# Patient Record
Sex: Female | Born: 1965 | Hispanic: Yes | Marital: Single | Smoking: Never smoker
Health system: Southern US, Community
[De-identification: ages and names within clinical notes are randomized; demographics above are authoritative.]

## PROBLEM LIST (undated history)

## (undated) DIAGNOSIS — K429 Umbilical hernia without obstruction or gangrene: Secondary | ICD-10-CM

## (undated) DIAGNOSIS — D696 Thrombocytopenia, unspecified: Secondary | ICD-10-CM

## (undated) DIAGNOSIS — Z1635 Resistance to multiple antimicrobial drugs: Secondary | ICD-10-CM

## (undated) DIAGNOSIS — S065X9A Traumatic subdural hemorrhage with loss of consciousness of unspecified duration, initial encounter: Secondary | ICD-10-CM

## (undated) DIAGNOSIS — K7460 Unspecified cirrhosis of liver: Secondary | ICD-10-CM

## (undated) DIAGNOSIS — D649 Anemia, unspecified: Secondary | ICD-10-CM

## (undated) DIAGNOSIS — S065XAA Traumatic subdural hemorrhage with loss of consciousness status unknown, initial encounter: Secondary | ICD-10-CM

## (undated) HISTORY — PX: ABDOMINAL HYSTERECTOMY: SHX81

---

## 2010-07-05 ENCOUNTER — Emergency Department: Payer: Self-pay | Admitting: Unknown Physician Specialty

## 2011-10-24 ENCOUNTER — Ambulatory Visit: Payer: Self-pay | Admitting: Internal Medicine

## 2011-11-14 ENCOUNTER — Inpatient Hospital Stay: Payer: Self-pay | Admitting: Internal Medicine

## 2011-11-14 LAB — URINALYSIS, COMPLETE
Bilirubin,UR: NEGATIVE
Ketone: NEGATIVE
Ph: 7 (ref 4.5–8.0)
Protein: 100
RBC,UR: 29 /HPF (ref 0–5)
Squamous Epithelial: 2
WBC UR: 12 /HPF (ref 0–5)

## 2011-11-14 LAB — CBC WITH DIFFERENTIAL/PLATELET
Basophil #: 0 10*3/uL (ref 0.0–0.1)
Eosinophil %: 3.1 %
HCT: 35.7 % (ref 35.0–47.0)
HGB: 11.5 g/dL — ABNORMAL LOW (ref 12.0–16.0)
Lymphocyte #: 0.2 10*3/uL — ABNORMAL LOW (ref 1.0–3.6)
Lymphocyte %: 12.9 %
MCV: 93 fL (ref 80–100)
Monocyte %: 6.4 %
Neutrophil #: 1.5 10*3/uL (ref 1.4–6.5)
RBC: 3.84 10*6/uL (ref 3.80–5.20)
RDW: 17.2 % — ABNORMAL HIGH (ref 11.5–14.5)
WBC: 1.9 10*3/uL — CL (ref 3.6–11.0)

## 2011-11-14 LAB — APTT: Activated PTT: 39.5 secs — ABNORMAL HIGH (ref 23.6–35.9)

## 2011-11-14 LAB — COMPREHENSIVE METABOLIC PANEL
Albumin: 1.9 g/dL — ABNORMAL LOW (ref 3.4–5.0)
Alkaline Phosphatase: 158 U/L — ABNORMAL HIGH (ref 50–136)
Anion Gap: 8 (ref 7–16)
Calcium, Total: 6.7 mg/dL — CL (ref 8.5–10.1)
Co2: 26 mmol/L (ref 21–32)
Creatinine: 0.49 mg/dL — ABNORMAL LOW (ref 0.60–1.30)
EGFR (Non-African Amer.): >60
Glucose: 123 mg/dL — ABNORMAL HIGH (ref 65–99)
Osmolality: 279 (ref 275–301)
SGOT(AST): 89 U/L — ABNORMAL HIGH (ref 15–37)
Sodium: 141 mmol/L (ref 136–145)

## 2011-11-14 LAB — ETHANOL: Ethanol %: 0.344 % (ref 0.000–0.080)

## 2011-11-14 LAB — LIPASE, BLOOD: Lipase: 189 U/L (ref 73–393)

## 2011-11-14 LAB — PROTIME-INR: INR: 1.6

## 2011-11-15 LAB — MAGNESIUM: Magnesium: 1.2 mg/dL — ABNORMAL LOW

## 2011-11-15 LAB — CBC WITH DIFFERENTIAL/PLATELET
Basophil #: 0 10*3/uL (ref 0.0–0.1)
Eosinophil %: 4.1 %
HCT: 33.1 % — ABNORMAL LOW (ref 35.0–47.0)
Lymphocyte #: 0.3 10*3/uL — ABNORMAL LOW (ref 1.0–3.6)
MCH: 30.4 pg (ref 26.0–34.0)
MCV: 93 fL (ref 80–100)
Monocyte #: 0.1 x10 3/mm — ABNORMAL LOW (ref 0.2–0.9)
Neutrophil #: 1.1 10*3/uL — ABNORMAL LOW (ref 1.4–6.5)
Platelet: 23 10*3/uL — CL (ref 150–440)
RBC: 3.55 10*6/uL — ABNORMAL LOW (ref 3.80–5.20)
RDW: 17.2 % — ABNORMAL HIGH (ref 11.5–14.5)
WBC: 1.6 10*3/uL — CL (ref 3.6–11.0)

## 2011-11-15 LAB — BASIC METABOLIC PANEL
Calcium, Total: 6.7 mg/dL — CL (ref 8.5–10.1)
Chloride: 107 mmol/L (ref 98–107)
Co2: 25 mmol/L (ref 21–32)
Creatinine: 0.55 mg/dL — ABNORMAL LOW (ref 0.60–1.30)
EGFR (African American): >60
EGFR (Non-African Amer.): >60
Glucose: 112 mg/dL — ABNORMAL HIGH (ref 65–99)
Potassium: 3.2 mmol/L — ABNORMAL LOW (ref 3.5–5.1)
Sodium: 141 mmol/L (ref 136–145)

## 2011-11-15 LAB — AMMONIA: Ammonia, Plasma: 106 mcmol/L — ABNORMAL HIGH (ref 11–32)

## 2011-11-16 LAB — CBC WITH DIFFERENTIAL/PLATELET
Basophil #: 0 10*3/uL (ref 0.0–0.1)
Eosinophil #: 0.1 10*3/uL (ref 0.0–0.7)
Lymphocyte #: 0.2 10*3/uL — ABNORMAL LOW (ref 1.0–3.6)
MCH: 29.9 pg (ref 26.0–34.0)
MCHC: 31.9 g/dL — ABNORMAL LOW (ref 32.0–36.0)
MCV: 94 fL (ref 80–100)
Monocyte #: 0.2 x10 3/mm (ref 0.2–0.9)
Neutrophil #: 1.6 10*3/uL (ref 1.4–6.5)
Neutrophil %: 76.3 %
Platelet: 30 10*3/uL — CL (ref 150–440)
RBC: 3.55 10*6/uL — ABNORMAL LOW (ref 3.80–5.20)

## 2011-11-16 LAB — BODY FLUID CELL COUNT WITH DIFFERENTIAL
Lymphocytes: 1 %
Neutrophils: 6 %
Other Cells BF: 0 %

## 2011-11-16 LAB — PROTIME-INR
INR: 1.7
Prothrombin Time: 19.9 secs — ABNORMAL HIGH (ref 11.5–14.7)

## 2011-11-17 LAB — CBC WITH DIFFERENTIAL/PLATELET
Basophil #: 0 10*3/uL (ref 0.0–0.1)
Basophil %: 1.7 %
Eosinophil %: 3.8 %
Lymphocyte #: 0.2 10*3/uL — ABNORMAL LOW (ref 1.0–3.6)
MCHC: 33.1 g/dL (ref 32.0–36.0)
MCV: 94 fL (ref 80–100)
Monocyte %: 9 %
Neutrophil %: 77.3 %
Platelet: 43 10*3/uL — ABNORMAL LOW (ref 150–440)
RBC: 3.28 10*6/uL — ABNORMAL LOW (ref 3.80–5.20)
RDW: 17 % — ABNORMAL HIGH (ref 11.5–14.5)
WBC: 2.1 10*3/uL — ABNORMAL LOW (ref 3.6–11.0)

## 2011-11-17 LAB — BASIC METABOLIC PANEL
Anion Gap: 9 (ref 7–16)
BUN: 3 mg/dL — ABNORMAL LOW (ref 7–18)
Chloride: 107 mmol/L (ref 98–107)
EGFR (African American): >60
Glucose: 97 mg/dL (ref 65–99)
Osmolality: 276 (ref 275–301)
Sodium: 140 mmol/L (ref 136–145)

## 2011-11-17 LAB — HEPATIC FUNCTION PANEL A (ARMC)
Alkaline Phosphatase: 149 U/L — ABNORMAL HIGH (ref 50–136)
Bilirubin,Total: 3.8 mg/dL — ABNORMAL HIGH (ref 0.2–1.0)
Total Protein: 6.3 g/dL — ABNORMAL LOW (ref 6.4–8.2)

## 2011-11-18 LAB — BASIC METABOLIC PANEL
Anion Gap: 7 (ref 7–16)
BUN: 2 mg/dL — ABNORMAL LOW (ref 7–18)
Co2: 26 mmol/L (ref 21–32)
Creatinine: 0.69 mg/dL (ref 0.60–1.30)
EGFR (African American): >60
EGFR (Non-African Amer.): >60

## 2011-11-18 LAB — CBC WITH DIFFERENTIAL/PLATELET
Basophil #: 0 10*3/uL (ref 0.0–0.1)
Basophil %: 0.8 %
Basophil %: 0.6 %
Eosinophil #: 0.1 10*3/uL (ref 0.0–0.7)
Eosinophil %: 3.9 %
Eosinophil %: 3.1 %
HCT: 30.7 % — ABNORMAL LOW (ref 35.0–47.0)
HGB: 10.6 g/dL — ABNORMAL LOW (ref 12.0–16.0)
Lymphocyte #: 0.2 10*3/uL — ABNORMAL LOW (ref 1.0–3.6)
MCH: 31.5 pg (ref 26.0–34.0)
MCHC: 34 g/dL (ref 32.0–36.0)
MCV: 93 fL (ref 80–100)
MCV: 93 fL (ref 80–100)
Monocyte #: 0.2 x10 3/mm (ref 0.2–0.9)
Monocyte %: 7.2 %
Monocyte %: 6.9 %
Neutrophil #: 3 10*3/uL (ref 1.4–6.5)
Neutrophil %: 81.4 %
Neutrophil %: 83.5 %
Platelet: 65 10*3/uL — ABNORMAL LOW (ref 150–440)
RBC: 3.37 10*6/uL — ABNORMAL LOW (ref 3.80–5.20)
RDW: 17.5 % — ABNORMAL HIGH (ref 11.5–14.5)
WBC: 3.6 10*3/uL (ref 3.6–11.0)

## 2011-11-18 LAB — PROTIME-INR
INR: 1.9
Prothrombin Time: 22.1 secs — ABNORMAL HIGH (ref 11.5–14.7)

## 2011-11-19 LAB — BASIC METABOLIC PANEL
Anion Gap: 9 (ref 7–16)
Calcium, Total: 7.5 mg/dL — ABNORMAL LOW (ref 8.5–10.1)
Co2: 25 mmol/L (ref 21–32)
EGFR (African American): >60
EGFR (Non-African Amer.): >60
Glucose: 103 mg/dL — ABNORMAL HIGH (ref 65–99)
Osmolality: 265 (ref 275–301)
Sodium: 134 mmol/L — ABNORMAL LOW (ref 136–145)

## 2011-11-20 LAB — BODY FLUID CULTURE

## 2011-11-24 ENCOUNTER — Ambulatory Visit: Payer: Self-pay | Admitting: Internal Medicine

## 2011-12-18 DIAGNOSIS — IMO0002 Reserved for concepts with insufficient information to code with codable children: Secondary | ICD-10-CM

## 2011-12-18 HISTORY — DX: Reserved for concepts with insufficient information to code with codable children: IMO0002

## 2011-12-18 LAB — COMPREHENSIVE METABOLIC PANEL
Albumin: 2.1 g/dL — ABNORMAL LOW (ref 3.4–5.0)
Alkaline Phosphatase: 131 U/L (ref 50–136)
Anion Gap: 13 (ref 7–16)
BUN: 52 mg/dL — ABNORMAL HIGH (ref 7–18)
Calcium, Total: 8 mg/dL — ABNORMAL LOW (ref 8.5–10.1)
Creatinine: 1.9 mg/dL — ABNORMAL HIGH (ref 0.60–1.30)
EGFR (Non-African Amer.): 31 — ABNORMAL LOW
Glucose: 136 mg/dL — ABNORMAL HIGH (ref 65–99)
Osmolality: 281 (ref 275–301)
Potassium: 4.3 mmol/L (ref 3.5–5.1)
SGPT (ALT): 21 U/L (ref 12–78)
Sodium: 132 mmol/L — ABNORMAL LOW (ref 136–145)
Total Protein: 6.5 g/dL (ref 6.4–8.2)

## 2011-12-18 LAB — ETHANOL: Ethanol: <3 mg/dL

## 2011-12-18 LAB — CBC
HCT: 36.2 % (ref 35.0–47.0)
HGB: 12.3 g/dL (ref 12.0–16.0)
MCH: 31.2 pg (ref 26.0–34.0)
MCHC: 34 g/dL (ref 32.0–36.0)
MCV: 92 fL (ref 80–100)
Platelet: 31 10*3/uL — ABNORMAL LOW (ref 150–440)
RBC: 3.95 10*6/uL (ref 3.80–5.20)
WBC: 13.7 10*3/uL — ABNORMAL HIGH (ref 3.6–11.0)

## 2011-12-18 LAB — URINALYSIS, COMPLETE
Bilirubin,UR: NEGATIVE
Glucose,UR: NEGATIVE mg/dL (ref 0–75)
Nitrite: NEGATIVE
Specific Gravity: 1.014 (ref 1.003–1.030)
Squamous Epithelial: NONE SEEN
WBC UR: 815 /HPF (ref 0–5)

## 2011-12-18 LAB — DRUG SCREEN, URINE
Amphetamines, Ur Screen: NEGATIVE (ref ?–1000)
Benzodiazepine, Ur Scrn: NEGATIVE (ref ?–200)
Cocaine Metabolite,Ur ~~LOC~~: NEGATIVE (ref ?–300)
MDMA (Ecstasy)Ur Screen: NEGATIVE (ref ?–500)
Methadone, Ur Screen: NEGATIVE (ref ?–300)
Phencyclidine (PCP) Ur S: NEGATIVE (ref ?–25)
Tricyclic, Ur Screen: NEGATIVE (ref ?–1000)

## 2011-12-18 LAB — CK TOTAL AND CKMB (NOT AT ARMC)
CK, Total: 44 U/L (ref 21–215)
CK-MB: <0.5 ng/mL — ABNORMAL LOW (ref 0.5–3.6)

## 2011-12-18 LAB — TSH: Thyroid Stimulating Horm: 3.55 u[IU]/mL

## 2011-12-18 LAB — PROTIME-INR
INR: 2.1
Prothrombin Time: 23.5 secs — ABNORMAL HIGH (ref 11.5–14.7)

## 2011-12-19 ENCOUNTER — Inpatient Hospital Stay: Payer: Self-pay | Admitting: Internal Medicine

## 2011-12-20 LAB — BASIC METABOLIC PANEL
BUN: 32 mg/dL — ABNORMAL HIGH (ref 7–18)
Chloride: 104 mmol/L (ref 98–107)
Creatinine: 1.42 mg/dL — ABNORMAL HIGH (ref 0.60–1.30)
EGFR (Non-African Amer.): 44 — ABNORMAL LOW
Glucose: 104 mg/dL — ABNORMAL HIGH (ref 65–99)
Potassium: 4 mmol/L (ref 3.5–5.1)
Sodium: 132 mmol/L — ABNORMAL LOW (ref 136–145)

## 2011-12-20 LAB — CBC WITH DIFFERENTIAL/PLATELET
Basophil #: 0.1 10*3/uL (ref 0.0–0.1)
Basophil %: 0.6 %
Eosinophil #: 0.2 10*3/uL (ref 0.0–0.7)
HCT: 31.8 % — ABNORMAL LOW (ref 35.0–47.0)
HGB: 10.9 g/dL — ABNORMAL LOW (ref 12.0–16.0)
Lymphocyte #: 0.2 10*3/uL — ABNORMAL LOW (ref 1.0–3.6)
Lymphocyte %: 1.6 %
MCH: 30.9 pg (ref 26.0–34.0)
MCHC: 34.3 g/dL (ref 32.0–36.0)
MCV: 90 fL (ref 80–100)
Monocyte #: 0.9 x10 3/mm (ref 0.2–0.9)
Neutrophil #: 8.2 10*3/uL — ABNORMAL HIGH (ref 1.4–6.5)
RBC: 3.53 10*6/uL — ABNORMAL LOW (ref 3.80–5.20)
RDW: 17.3 % — ABNORMAL HIGH (ref 11.5–14.5)
WBC: 9.5 10*3/uL (ref 3.6–11.0)

## 2011-12-20 LAB — URINE CULTURE

## 2011-12-21 LAB — CBC WITH DIFFERENTIAL/PLATELET
Basophil #: 0 10*3/uL (ref 0.0–0.1)
Basophil %: 0.3 %
Eosinophil #: 0.1 10*3/uL (ref 0.0–0.7)
Eosinophil %: 1.1 %
HCT: 32.9 % — ABNORMAL LOW (ref 35.0–47.0)
HGB: 11.4 g/dL — ABNORMAL LOW (ref 12.0–16.0)
Lymphocyte #: 0.3 10*3/uL — ABNORMAL LOW (ref 1.0–3.6)
MCH: 30.9 pg (ref 26.0–34.0)
MCHC: 34.5 g/dL (ref 32.0–36.0)
MCV: 90 fL (ref 80–100)
Monocyte #: 1.3 x10 3/mm — ABNORMAL HIGH (ref 0.2–0.9)
Monocyte %: 13.3 %
Neutrophil #: 8.1 10*3/uL — ABNORMAL HIGH (ref 1.4–6.5)
Platelet: 21 10*3/uL — CL (ref 150–440)
RDW: 17.8 % — ABNORMAL HIGH (ref 11.5–14.5)
WBC: 9.9 10*3/uL (ref 3.6–11.0)

## 2011-12-21 LAB — BASIC METABOLIC PANEL
BUN: 27 mg/dL — ABNORMAL HIGH (ref 7–18)
Calcium, Total: 7.7 mg/dL — ABNORMAL LOW (ref 8.5–10.1)
Chloride: 104 mmol/L (ref 98–107)
Creatinine: 1.14 mg/dL (ref 0.60–1.30)
EGFR (African American): >60
EGFR (Non-African Amer.): 57 — ABNORMAL LOW
Glucose: 95 mg/dL (ref 65–99)
Osmolality: 273 (ref 275–301)
Potassium: 3.6 mmol/L (ref 3.5–5.1)

## 2011-12-22 LAB — CULTURE, BLOOD (SINGLE)

## 2012-01-11 LAB — URINALYSIS, COMPLETE
Bacteria: NONE SEEN
Bilirubin,UR: NEGATIVE
Nitrite: NEGATIVE
RBC,UR: 37 /HPF (ref 0–5)
Squamous Epithelial: 5

## 2012-01-11 LAB — CBC
HCT: 28.7 % — ABNORMAL LOW (ref 35.0–47.0)
HGB: 9.9 g/dL — ABNORMAL LOW (ref 12.0–16.0)
Platelet: 34 10*3/uL — ABNORMAL LOW (ref 150–440)
RBC: 3.1 10*6/uL — ABNORMAL LOW (ref 3.80–5.20)
RDW: 22.8 % — ABNORMAL HIGH (ref 11.5–14.5)
WBC: 2.3 10*3/uL — ABNORMAL LOW (ref 3.6–11.0)

## 2012-01-11 LAB — COMPREHENSIVE METABOLIC PANEL
Albumin: 1.8 g/dL — ABNORMAL LOW (ref 3.4–5.0)
BUN: 7 mg/dL (ref 7–18)
Co2: 19 mmol/L — ABNORMAL LOW (ref 21–32)
Glucose: 116 mg/dL — ABNORMAL HIGH (ref 65–99)
SGOT(AST): 33 U/L (ref 15–37)
SGPT (ALT): 13 U/L (ref 12–78)
Total Protein: 6 g/dL — ABNORMAL LOW (ref 6.4–8.2)

## 2012-01-12 ENCOUNTER — Observation Stay: Payer: Self-pay | Admitting: Specialist

## 2012-01-12 ENCOUNTER — Ambulatory Visit: Payer: Self-pay | Admitting: Internal Medicine

## 2012-01-12 LAB — CBC WITH DIFFERENTIAL/PLATELET
Eosinophil %: 7 %
Lymphocyte %: 17.7 %
MCH: 31.3 pg (ref 26.0–34.0)
Monocyte #: 0.3 x10 3/mm (ref 0.2–0.9)
Monocyte %: 14.9 %
Neutrophil #: 1.2 10*3/uL — ABNORMAL LOW (ref 1.4–6.5)
Neutrophil %: 59.3 %
Platelet: 33 10*3/uL — ABNORMAL LOW (ref 150–440)
RBC: 2.9 10*6/uL — ABNORMAL LOW (ref 3.80–5.20)
WBC: 2 10*3/uL — CL (ref 3.6–11.0)

## 2012-01-12 LAB — BASIC METABOLIC PANEL
BUN: 7 mg/dL (ref 7–18)
Calcium, Total: 7.3 mg/dL — ABNORMAL LOW (ref 8.5–10.1)
EGFR (African American): >60
EGFR (Non-African Amer.): >60
Glucose: 103 mg/dL — ABNORMAL HIGH (ref 65–99)
Osmolality: 279 (ref 275–301)

## 2012-01-12 LAB — PROTIME-INR
INR: 2.1
Prothrombin Time: 23.6 secs — ABNORMAL HIGH (ref 11.5–14.7)

## 2012-01-12 LAB — APTT: Activated PTT: 64.4 secs — ABNORMAL HIGH (ref 23.6–35.9)

## 2012-01-13 LAB — APTT: Activated PTT: 42.8 secs — ABNORMAL HIGH (ref 23.6–35.9)

## 2012-01-13 LAB — PROTIME-INR: INR: 2

## 2012-01-13 LAB — PLATELET COUNT
Platelet: 25 10*3/uL — CL (ref 150–440)
Platelet: 44 10*3/uL — ABNORMAL LOW (ref 150–440)

## 2012-01-24 ENCOUNTER — Ambulatory Visit: Payer: Self-pay | Admitting: Internal Medicine

## 2012-12-21 ENCOUNTER — Inpatient Hospital Stay: Payer: Self-pay | Admitting: Family Medicine

## 2012-12-21 LAB — COMPREHENSIVE METABOLIC PANEL
Alkaline Phosphatase: 134 U/L (ref 50–136)
BUN: 8 mg/dL (ref 7–18)
Calcium, Total: 7.5 mg/dL — ABNORMAL LOW (ref 8.5–10.1)
Creatinine: 0.8 mg/dL (ref 0.60–1.30)
Glucose: 97 mg/dL (ref 65–99)
Potassium: 3.3 mmol/L — ABNORMAL LOW (ref 3.5–5.1)
SGPT (ALT): 13 U/L (ref 12–78)
Sodium: 137 mmol/L (ref 136–145)
Total Protein: 6.1 g/dL — ABNORMAL LOW (ref 6.4–8.2)

## 2012-12-21 LAB — URINALYSIS, COMPLETE
Bacteria: NONE SEEN
Bilirubin,UR: NEGATIVE
Glucose,UR: NEGATIVE mg/dL (ref 0–75)
Ketone: NEGATIVE
Leukocyte Esterase: NEGATIVE
Nitrite: NEGATIVE
Protein: NEGATIVE
Squamous Epithelial: 3
WBC UR: 1 /HPF (ref 0–5)

## 2012-12-21 LAB — CBC
HGB: 10 g/dL — ABNORMAL LOW (ref 12.0–16.0)
MCH: 32.9 pg (ref 26.0–34.0)
RBC: 3.03 10*6/uL — ABNORMAL LOW (ref 3.80–5.20)
WBC: 2.2 10*3/uL — ABNORMAL LOW (ref 3.6–11.0)

## 2012-12-21 LAB — PROTIME-INR: Prothrombin Time: 22 secs — ABNORMAL HIGH (ref 11.5–14.7)

## 2012-12-22 LAB — CBC WITH DIFFERENTIAL/PLATELET
Basophil %: 1 %
Eosinophil #: 0.2 10*3/uL (ref 0.0–0.7)
HGB: 9.5 g/dL — ABNORMAL LOW (ref 12.0–16.0)
Lymphocyte %: 14.9 %
MCH: 32.6 pg (ref 26.0–34.0)
MCV: 93 fL (ref 80–100)
Monocyte #: 0.3 x10 3/mm (ref 0.2–0.9)
Monocyte %: 11.5 %
Neutrophil %: 65.5 %
Platelet: 57 10*3/uL — ABNORMAL LOW (ref 150–440)
RBC: 2.9 10*6/uL — ABNORMAL LOW (ref 3.80–5.20)
RDW: 16.8 % — ABNORMAL HIGH (ref 11.5–14.5)
WBC: 2.3 10*3/uL — ABNORMAL LOW (ref 3.6–11.0)

## 2012-12-22 LAB — COMPREHENSIVE METABOLIC PANEL
Alkaline Phosphatase: 126 U/L (ref 50–136)
BUN: 8 mg/dL (ref 7–18)
Bilirubin,Total: 2.6 mg/dL — ABNORMAL HIGH (ref 0.2–1.0)
Calcium, Total: 7.4 mg/dL — ABNORMAL LOW (ref 8.5–10.1)
Co2: 24 mmol/L (ref 21–32)
EGFR (Non-African Amer.): >60
Glucose: 95 mg/dL (ref 65–99)
Osmolality: 276 (ref 275–301)
Potassium: 3.3 mmol/L — ABNORMAL LOW (ref 3.5–5.1)
Sodium: 139 mmol/L (ref 136–145)

## 2012-12-22 LAB — MAGNESIUM: Magnesium: 1.2 mg/dL — ABNORMAL LOW

## 2012-12-22 LAB — PROTIME-INR: INR: 1.8

## 2012-12-23 LAB — COMPREHENSIVE METABOLIC PANEL
Albumin: 1.7 g/dL — ABNORMAL LOW (ref 3.4–5.0)
Bilirubin,Total: 2.8 mg/dL — ABNORMAL HIGH (ref 0.2–1.0)
Co2: 24 mmol/L (ref 21–32)
Creatinine: 0.8 mg/dL (ref 0.60–1.30)
EGFR (African American): >60
EGFR (Non-African Amer.): >60
Glucose: 90 mg/dL (ref 65–99)
Osmolality: 270 (ref 275–301)
Potassium: 3.8 mmol/L (ref 3.5–5.1)
SGOT(AST): 33 U/L (ref 15–37)
SGPT (ALT): 13 U/L (ref 12–78)
Sodium: 136 mmol/L (ref 136–145)

## 2012-12-24 LAB — COMPREHENSIVE METABOLIC PANEL
Albumin: 2 g/dL — ABNORMAL LOW (ref 3.4–5.0)
Alkaline Phosphatase: 122 U/L (ref 50–136)
BUN: 10 mg/dL (ref 7–18)
Bilirubin,Total: 3.1 mg/dL — ABNORMAL HIGH (ref 0.2–1.0)
Chloride: 102 mmol/L (ref 98–107)
Creatinine: 0.84 mg/dL (ref 0.60–1.30)
EGFR (African American): >60
EGFR (Non-African Amer.): >60
Glucose: 88 mg/dL (ref 65–99)
Potassium: 3.4 mmol/L — ABNORMAL LOW (ref 3.5–5.1)
SGPT (ALT): 14 U/L (ref 12–78)
Total Protein: 6.2 g/dL — ABNORMAL LOW (ref 6.4–8.2)

## 2012-12-24 LAB — PROTIME-INR: INR: 2

## 2012-12-24 LAB — PLATELET COUNT: Platelet: 61 10*3/uL — ABNORMAL LOW (ref 150–440)

## 2012-12-25 LAB — CBC WITH DIFFERENTIAL/PLATELET
Basophil #: 0 10*3/uL (ref 0.0–0.1)
Basophil %: 1.3 %
Eosinophil %: 8.2 %
Lymphocyte #: 0.4 10*3/uL — ABNORMAL LOW (ref 1.0–3.6)
MCV: 91 fL (ref 80–100)
Monocyte %: 12.9 %
Neutrophil #: 1.8 10*3/uL (ref 1.4–6.5)
RBC: 3.33 10*6/uL — ABNORMAL LOW (ref 3.80–5.20)
RDW: 16.8 % — ABNORMAL HIGH (ref 11.5–14.5)

## 2012-12-25 LAB — POTASSIUM: Potassium: 3.6 mmol/L (ref 3.5–5.1)

## 2012-12-25 LAB — PROTIME-INR: Prothrombin Time: 22.6 secs — ABNORMAL HIGH (ref 11.5–14.7)

## 2012-12-26 LAB — PROTIME-INR
INR: 1.7
Prothrombin Time: 19.5 secs — ABNORMAL HIGH (ref 11.5–14.7)

## 2013-07-25 ENCOUNTER — Emergency Department: Payer: Self-pay | Admitting: Emergency Medicine

## 2013-07-25 LAB — COMPREHENSIVE METABOLIC PANEL
ALK PHOS: 144 U/L — AB
ALT: 21 U/L (ref 12–78)
Albumin: 1.9 g/dL — ABNORMAL LOW (ref 3.4–5.0)
Anion Gap: 7 (ref 7–16)
BUN: 5 mg/dL — AB (ref 7–18)
Bilirubin,Total: 4.1 mg/dL — ABNORMAL HIGH (ref 0.2–1.0)
Calcium, Total: 6.9 mg/dL — CL (ref 8.5–10.1)
Chloride: 108 mmol/L — ABNORMAL HIGH (ref 98–107)
Co2: 26 mmol/L (ref 21–32)
Creatinine: 0.62 mg/dL (ref 0.60–1.30)
EGFR (African American): >60
Glucose: 113 mg/dL — ABNORMAL HIGH (ref 65–99)
OSMOLALITY: 279 (ref 275–301)
Potassium: 3.1 mmol/L — ABNORMAL LOW (ref 3.5–5.1)
SGOT(AST): 80 U/L — ABNORMAL HIGH (ref 15–37)
SODIUM: 141 mmol/L (ref 136–145)
Total Protein: 6.5 g/dL (ref 6.4–8.2)

## 2013-07-25 LAB — CBC
HCT: 31.6 % — AB (ref 35.0–47.0)
HGB: 10.5 g/dL — AB (ref 12.0–16.0)
MCH: 31.2 pg (ref 26.0–34.0)
MCHC: 33.3 g/dL (ref 32.0–36.0)
MCV: 94 fL (ref 80–100)
PLATELETS: 33 10*3/uL — AB (ref 150–440)
RBC: 3.37 10*6/uL — ABNORMAL LOW (ref 3.80–5.20)
RDW: 16.1 % — ABNORMAL HIGH (ref 11.5–14.5)
WBC: 1.8 10*3/uL — AB (ref 3.6–11.0)

## 2013-07-25 LAB — PROTIME-INR
INR: 2
Prothrombin Time: 22.6 secs — ABNORMAL HIGH (ref 11.5–14.7)

## 2013-07-25 LAB — APTT: Activated PTT: 47.4 secs — ABNORMAL HIGH (ref 23.6–35.9)

## 2013-07-26 ENCOUNTER — Encounter (HOSPITAL_COMMUNITY): Payer: Self-pay | Admitting: Pulmonary Disease

## 2013-07-26 ENCOUNTER — Inpatient Hospital Stay (HOSPITAL_COMMUNITY)
Admission: EM | Admit: 2013-07-26 | Discharge: 2013-08-07 | DRG: 085 | Disposition: A | Discharge status: Skilled Nursing Facility | Payer: Self-pay | Source: Other Acute Inpatient Hospital | Attending: Internal Medicine | Admitting: Internal Medicine

## 2013-07-26 ENCOUNTER — Inpatient Hospital Stay (HOSPITAL_COMMUNITY): Payer: Self-pay

## 2013-07-26 DIAGNOSIS — S065X0A Traumatic subdural hemorrhage without loss of consciousness, initial encounter: Principal | ICD-10-CM | POA: Diagnosis present

## 2013-07-26 DIAGNOSIS — E43 Unspecified severe protein-calorie malnutrition: Secondary | ICD-10-CM | POA: Diagnosis present

## 2013-07-26 DIAGNOSIS — S1093XA Contusion of unspecified part of neck, initial encounter: Secondary | ICD-10-CM

## 2013-07-26 DIAGNOSIS — R188 Other ascites: Secondary | ICD-10-CM | POA: Diagnosis present

## 2013-07-26 DIAGNOSIS — S0083XA Contusion of other part of head, initial encounter: Secondary | ICD-10-CM

## 2013-07-26 DIAGNOSIS — F10939 Alcohol use, unspecified with withdrawal, unspecified: Secondary | ICD-10-CM | POA: Diagnosis present

## 2013-07-26 DIAGNOSIS — Z6826 Body mass index (BMI) 26.0-26.9, adult: Secondary | ICD-10-CM

## 2013-07-26 DIAGNOSIS — K7030 Alcoholic cirrhosis of liver without ascites: Secondary | ICD-10-CM | POA: Diagnosis present

## 2013-07-26 DIAGNOSIS — F10239 Alcohol dependence with withdrawal, unspecified: Secondary | ICD-10-CM | POA: Diagnosis present

## 2013-07-26 DIAGNOSIS — D61818 Other pancytopenia: Secondary | ICD-10-CM | POA: Diagnosis present

## 2013-07-26 DIAGNOSIS — M79609 Pain in unspecified limb: Secondary | ICD-10-CM | POA: Diagnosis present

## 2013-07-26 DIAGNOSIS — M7989 Other specified soft tissue disorders: Secondary | ICD-10-CM | POA: Diagnosis present

## 2013-07-26 DIAGNOSIS — I10 Essential (primary) hypertension: Secondary | ICD-10-CM | POA: Diagnosis present

## 2013-07-26 DIAGNOSIS — G9340 Encephalopathy, unspecified: Secondary | ICD-10-CM | POA: Diagnosis present

## 2013-07-26 DIAGNOSIS — N390 Urinary tract infection, site not specified: Secondary | ICD-10-CM | POA: Diagnosis present

## 2013-07-26 DIAGNOSIS — F1020 Alcohol dependence, uncomplicated: Secondary | ICD-10-CM | POA: Diagnosis present

## 2013-07-26 DIAGNOSIS — R5383 Other fatigue: Secondary | ICD-10-CM

## 2013-07-26 DIAGNOSIS — K7682 Hepatic encephalopathy: Secondary | ICD-10-CM | POA: Diagnosis present

## 2013-07-26 DIAGNOSIS — R5381 Other malaise: Secondary | ICD-10-CM | POA: Diagnosis present

## 2013-07-26 DIAGNOSIS — E876 Hypokalemia: Secondary | ICD-10-CM | POA: Diagnosis present

## 2013-07-26 DIAGNOSIS — D696 Thrombocytopenia, unspecified: Secondary | ICD-10-CM

## 2013-07-26 DIAGNOSIS — K429 Umbilical hernia without obstruction or gangrene: Secondary | ICD-10-CM | POA: Diagnosis present

## 2013-07-26 DIAGNOSIS — G319 Degenerative disease of nervous system, unspecified: Secondary | ICD-10-CM | POA: Diagnosis present

## 2013-07-26 DIAGNOSIS — S0003XA Contusion of scalp, initial encounter: Secondary | ICD-10-CM | POA: Diagnosis present

## 2013-07-26 DIAGNOSIS — S065X9A Traumatic subdural hemorrhage with loss of consciousness of unspecified duration, initial encounter: Secondary | ICD-10-CM

## 2013-07-26 DIAGNOSIS — S065XAA Traumatic subdural hemorrhage with loss of consciousness status unknown, initial encounter: Secondary | ICD-10-CM

## 2013-07-26 DIAGNOSIS — A498 Other bacterial infections of unspecified site: Secondary | ICD-10-CM | POA: Diagnosis present

## 2013-07-26 DIAGNOSIS — S5010XA Contusion of unspecified forearm, initial encounter: Secondary | ICD-10-CM | POA: Diagnosis present

## 2013-07-26 DIAGNOSIS — K7290 Hepatic failure, unspecified without coma: Secondary | ICD-10-CM | POA: Diagnosis present

## 2013-07-26 DIAGNOSIS — W19XXXA Unspecified fall, initial encounter: Secondary | ICD-10-CM | POA: Diagnosis present

## 2013-07-26 DIAGNOSIS — R000 Tachycardia, unspecified: Secondary | ICD-10-CM | POA: Diagnosis present

## 2013-07-26 HISTORY — DX: Unspecified cirrhosis of liver: K74.60

## 2013-07-26 HISTORY — DX: Resistance to multiple antimicrobial drugs: Z16.35

## 2013-07-26 HISTORY — DX: Anemia, unspecified: D64.9

## 2013-07-26 HISTORY — DX: Umbilical hernia without obstruction or gangrene: K42.9

## 2013-07-26 HISTORY — DX: Traumatic subdural hemorrhage with loss of consciousness status unknown, initial encounter: S06.5XAA

## 2013-07-26 HISTORY — DX: Thrombocytopenia, unspecified: D69.6

## 2013-07-26 HISTORY — DX: Traumatic subdural hemorrhage with loss of consciousness of unspecified duration, initial encounter: S06.5X9A

## 2013-07-26 LAB — TROPONIN I: Troponin I: <0.3 ng/mL (ref <0.30)

## 2013-07-26 LAB — HEPATIC FUNCTION PANEL
ALT: 11 U/L (ref 0–35)
AST: 64 U/L — ABNORMAL HIGH (ref 0–37)
Albumin: 1.6 g/dL — ABNORMAL LOW (ref 3.5–5.2)
Alkaline Phosphatase: 109 U/L (ref 39–117)
BILIRUBIN INDIRECT: 1.5 mg/dL — AB (ref 0.3–0.9)
BILIRUBIN TOTAL: 3.3 mg/dL — AB (ref 0.3–1.2)
Bilirubin, Direct: 1.8 mg/dL — ABNORMAL HIGH (ref 0.0–0.3)
Total Protein: 5.3 g/dL — ABNORMAL LOW (ref 6.0–8.3)

## 2013-07-26 LAB — PROCALCITONIN: Procalcitonin: <0.1 ng/mL

## 2013-07-26 LAB — CBC
HCT: 25.5 % — ABNORMAL LOW (ref 36.0–46.0)
HCT: 26.9 % — ABNORMAL LOW (ref 36.0–46.0)
HEMOGLOBIN: 8.8 g/dL — AB (ref 12.0–15.0)
Hemoglobin: 9 g/dL — ABNORMAL LOW (ref 12.0–15.0)
MCH: 32 pg (ref 26.0–34.0)
MCH: 31.8 pg (ref 26.0–34.0)
MCHC: 34.5 g/dL (ref 30.0–36.0)
MCHC: 33.5 g/dL (ref 30.0–36.0)
MCV: 92.7 fL (ref 78.0–100.0)
MCV: 95.1 fL (ref 78.0–100.0)
Platelets: 20 10*3/uL — CL (ref 150–400)
Platelets: 35 10*3/uL — ABNORMAL LOW (ref 150–400)
RBC: 2.75 MIL/uL — ABNORMAL LOW (ref 3.87–5.11)
RBC: 2.83 MIL/uL — AB (ref 3.87–5.11)
RDW: 16 % — ABNORMAL HIGH (ref 11.5–15.5)
RDW: 15.8 % — AB (ref 11.5–15.5)
WBC: 0.9 10*3/uL — CL (ref 4.0–10.5)
WBC: 1 10*3/uL — AB (ref 4.0–10.5)

## 2013-07-26 LAB — ABO/RH: ABO/RH(D): A POS

## 2013-07-26 LAB — HIV ANTIBODY (ROUTINE TESTING W REFLEX): HIV: NONREACTIVE

## 2013-07-26 LAB — BASIC METABOLIC PANEL
BUN: 4 mg/dL — ABNORMAL LOW (ref 6–23)
CALCIUM: 6.7 mg/dL — AB (ref 8.4–10.5)
CO2: 23 mEq/L (ref 19–32)
CREATININE: 0.51 mg/dL (ref 0.50–1.10)
Chloride: 106 mEq/L (ref 96–112)
GFR calc Af Amer: >90 mL/min (ref >90)
GLUCOSE: 89 mg/dL (ref 70–99)
Potassium: 3.1 mEq/L — ABNORMAL LOW (ref 3.7–5.3)
SODIUM: 140 meq/L (ref 137–147)

## 2013-07-26 LAB — PHOSPHORUS: PHOSPHORUS: 3.1 mg/dL (ref 2.3–4.6)

## 2013-07-26 LAB — MAGNESIUM: MAGNESIUM: 1.1 mg/dL — AB (ref 1.5–2.5)

## 2013-07-26 LAB — LACTIC ACID, PLASMA: Lactic Acid, Venous: 1.6 mmol/L (ref 0.5–2.2)

## 2013-07-26 LAB — MRSA PCR SCREENING: MRSA by PCR: NEGATIVE

## 2013-07-26 MED ORDER — SODIUM CHLORIDE 0.9 % IV SOLN
INTRAVENOUS | Status: DC
  Administered 2013-07-26: 02:00:00 via INTRAVENOUS

## 2013-07-26 MED ORDER — LORAZEPAM 1 MG PO TABS
0.0000 mg | ORAL_TABLET | Freq: Four times a day (QID) | ORAL | Status: AC
  Administered 2013-07-26: 1 mg via ORAL
  Administered 2013-07-26: 2 mg via ORAL
  Administered 2013-07-27 (×2): 1 mg via ORAL
  Filled 2013-07-26 (×2): qty 1
  Filled 2013-07-26: qty 2
  Filled 2013-07-26: qty 1

## 2013-07-26 MED ORDER — FOLIC ACID 1 MG PO TABS
1.0000 mg | ORAL_TABLET | Freq: Every day | ORAL | Status: DC
  Administered 2013-07-26 – 2013-08-07 (×13): 1 mg via ORAL
  Filled 2013-07-26 (×13): qty 1

## 2013-07-26 MED ORDER — VITAMIN B-1 100 MG PO TABS
100.0000 mg | ORAL_TABLET | Freq: Every day | ORAL | Status: DC
  Administered 2013-07-26: 100 mg via ORAL
  Filled 2013-07-26 (×2): qty 1

## 2013-07-26 MED ORDER — BOOST / RESOURCE BREEZE PO LIQD
1.0000 | Freq: Three times a day (TID) | ORAL | Status: DC
  Administered 2013-07-26 – 2013-08-02 (×14): 1 via ORAL

## 2013-07-26 MED ORDER — LORAZEPAM 1 MG PO TABS
0.0000 mg | ORAL_TABLET | Freq: Two times a day (BID) | ORAL | Status: AC
  Administered 2013-07-28: 2 mg via ORAL
  Administered 2013-07-28: 4 mg via ORAL
  Administered 2013-07-29: 2 mg via ORAL
  Administered 2013-07-29: 1 mg via ORAL
  Filled 2013-07-26 (×2): qty 2
  Filled 2013-07-26: qty 4
  Filled 2013-07-26: qty 2

## 2013-07-26 MED ORDER — LORAZEPAM 2 MG/ML IJ SOLN
1.0000 mg | Freq: Four times a day (QID) | INTRAMUSCULAR | Status: AC | PRN
  Administered 2013-07-26: 18:00:00 via INTRAVENOUS
  Administered 2013-07-28: 1 mg via INTRAVENOUS
  Filled 2013-07-26 (×3): qty 1

## 2013-07-26 MED ORDER — ONDANSETRON HCL 4 MG/2ML IJ SOLN
4.0000 mg | Freq: Four times a day (QID) | INTRAMUSCULAR | Status: DC | PRN
Start: 2013-07-26 — End: 2013-07-26
  Administered 2013-07-26: 4 mg via INTRAVENOUS
  Filled 2013-07-26: qty 2

## 2013-07-26 MED ORDER — ONDANSETRON HCL 4 MG PO TABS: 4.0000 mg | ORAL_TABLET | Freq: Four times a day (QID) | ORAL | Status: DC | PRN

## 2013-07-26 MED ORDER — LORAZEPAM 1 MG PO TABS
1.0000 mg | ORAL_TABLET | Freq: Four times a day (QID) | ORAL | Status: AC | PRN
  Administered 2013-07-27 – 2013-07-28 (×2): 1 mg via ORAL
  Filled 2013-07-26: qty 1

## 2013-07-26 MED ORDER — THIAMINE HCL 100 MG/ML IJ SOLN
100.0000 mg | Freq: Every day | INTRAMUSCULAR | Status: DC
  Filled 2013-07-26 (×2): qty 1

## 2013-07-26 MED ORDER — SODIUM CHLORIDE 0.9 % IV SOLN: INTRAVENOUS | Status: DC

## 2013-07-26 MED ORDER — FENTANYL CITRATE 0.05 MG/ML IJ SOLN
12.5000 ug | INTRAMUSCULAR | Status: DC | PRN
  Administered 2013-07-26 – 2013-07-28 (×4): 25 ug via INTRAVENOUS
  Filled 2013-07-26 (×4): qty 2

## 2013-07-26 MED ORDER — MAGNESIUM SULFATE 4000MG/100ML IJ SOLN
4.0000 g | Freq: Once | INTRAMUSCULAR | Status: AC
  Administered 2013-07-27: 4 g via INTRAVENOUS
  Filled 2013-07-26: qty 100

## 2013-07-26 MED ORDER — ADULT MULTIVITAMIN W/MINERALS CH
1.0000 | ORAL_TABLET | Freq: Every day | ORAL | Status: DC
  Administered 2013-07-26 – 2013-08-07 (×12): 1 via ORAL
  Filled 2013-07-26 (×13): qty 1

## 2013-07-26 MED ORDER — LACTULOSE 10 GM/15ML PO SOLN
30.0000 g | Freq: Three times a day (TID) | ORAL | Status: DC
  Administered 2013-07-26 – 2013-07-29 (×11): 30 g via ORAL
  Filled 2013-07-26 (×13): qty 45
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
## 2013-07-26 NOTE — Progress Notes (Signed)
Patient ID: Colleen Greene, female   DOB: [birth year], 48 y.o.   MRN: [id number] BP 112/63  Pulse 97  Temp(Src) 99.6 F (37.6 C) (Oral)  Resp 19  Ht 5' (1.524 m)  Wt 62.4 kg (137 lb 9.1 oz)  BMI 26.87 kg/m2  SpO2 95% Slightly lethargic Following commands Appreciate ccm help Do not believe CT needed now.  Need to correct metabolic abnormalities where possible.  Neuro exam is stable. Continue alcohol withdrawal protocol. I do believe this is reason for the lethargy currently.
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
## 2013-07-26 NOTE — Progress Notes (Signed)
Utilization review completed. Reginaldo Hazard, RN, BSN. 

## 2013-07-26 NOTE — Progress Notes (Signed)
eLink Physician-Brief Progress Note Patient Name: Colleen RetortMaria Greene DOB: 1965/11/12 MRN: [id number]  Date of Service  07/26/2013   HPI/Events of Note     eICU Interventions  hypomag-repleted   Intervention Category Intermediate Interventions: Electrolyte abnormality - evaluation and management  ALVA,RAKESH V. 07/26/2013, 11:38 PM
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
## 2013-07-26 NOTE — Progress Notes (Signed)
CRITICAL VALUE ALERT  Critical value received:  WBC 0.9  Date of notification:  07/26/2013  Time of notification:  0630  Critical value read back: Yes  Nurse who received alert:  Ripley FraiseHarper, Madissen Wyse D  MD notified (1st page):  ELINK  Time of first page:  0640  MD notified (2nd page):  Time of second page:  Responding MD:  Pola CornELINK  Time MD responded:  [phone]

## 2013-07-27 DIAGNOSIS — E43 Unspecified severe protein-calorie malnutrition: Secondary | ICD-10-CM

## 2013-07-27 LAB — CBC WITH DIFFERENTIAL/PLATELET
BASOS PCT: 1 % (ref 0–1)
Basophils Absolute: 0 10*3/uL (ref 0.0–0.1)
Basophils Absolute: 0 10*3/uL (ref 0.0–0.1)
Basophils Relative: 0 % (ref 0–1)
EOS ABS: 0 10*3/uL (ref 0.0–0.7)
EOS PCT: 2 % (ref 0–5)
Eosinophils Absolute: 0 10*3/uL (ref 0.0–0.7)
Eosinophils Relative: 3 % (ref 0–5)
HCT: 27.1 % — ABNORMAL LOW (ref 36.0–46.0)
HCT: 25.6 % — ABNORMAL LOW (ref 36.0–46.0)
HEMOGLOBIN: 8.5 g/dL — AB (ref 12.0–15.0)
Hemoglobin: 8.9 g/dL — ABNORMAL LOW (ref 12.0–15.0)
LYMPHS ABS: 0.1 10*3/uL — AB (ref 0.7–4.0)
Lymphocytes Relative: 16 % (ref 12–46)
Lymphocytes Relative: 9 % — ABNORMAL LOW (ref 12–46)
Lymphs Abs: 0.2 10*3/uL — ABNORMAL LOW (ref 0.7–4.0)
MCH: 31.1 pg (ref 26.0–34.0)
MCH: 31.4 pg (ref 26.0–34.0)
MCHC: 32.8 g/dL (ref 30.0–36.0)
MCHC: 33.2 g/dL (ref 30.0–36.0)
MCV: 94.8 fL (ref 78.0–100.0)
MCV: 94.5 fL (ref 78.0–100.0)
MONO ABS: 0.1 10*3/uL (ref 0.1–1.0)
Monocytes Absolute: 0.1 10*3/uL (ref 0.1–1.0)
Monocytes Relative: 12 % (ref 3–12)
Monocytes Relative: 12 % (ref 3–12)
NEUTROS PCT: 76 % (ref 43–77)
Neutro Abs: 0.8 10*3/uL — ABNORMAL LOW (ref 1.7–7.7)
Neutro Abs: 1 10*3/uL — ABNORMAL LOW (ref 1.7–7.7)
Neutrophils Relative %: 69 % (ref 43–77)
PLATELETS: 23 10*3/uL — AB (ref 150–400)
Platelets: 20 10*3/uL — CL (ref 150–400)
RBC: 2.86 MIL/uL — ABNORMAL LOW (ref 3.87–5.11)
RBC: 2.71 MIL/uL — AB (ref 3.87–5.11)
RDW: 15.7 % — ABNORMAL HIGH (ref 11.5–15.5)
RDW: 15.8 % — ABNORMAL HIGH (ref 11.5–15.5)
WBC: 1.1 10*3/uL — AB (ref 4.0–10.5)
WBC: 1.2 10*3/uL — AB (ref 4.0–10.5)

## 2013-07-27 LAB — BASIC METABOLIC PANEL
BUN: 4 mg/dL — ABNORMAL LOW (ref 6–23)
CALCIUM: 7 mg/dL — AB (ref 8.4–10.5)
CO2: 20 mEq/L (ref 19–32)
CREATININE: 0.5 mg/dL (ref 0.50–1.10)
Chloride: 102 mEq/L (ref 96–112)
GFR calc Af Amer: >90 mL/min (ref >90)
GFR calc non Af Amer: >90 mL/min (ref >90)
Glucose, Bld: 124 mg/dL — ABNORMAL HIGH (ref 70–99)
Potassium: 3 mEq/L — ABNORMAL LOW (ref 3.7–5.3)
Sodium: 138 mEq/L (ref 137–147)

## 2013-07-27 LAB — PREPARE PLATELET PHERESIS: Unit division: 0

## 2013-07-27 LAB — AMMONIA: AMMONIA: 71 umol/L — AB (ref 11–60)

## 2013-07-27 LAB — MAGNESIUM: Magnesium: 2 mg/dL (ref 1.5–2.5)

## 2013-07-27 LAB — TROPONIN I

## 2013-07-27 LAB — PROCALCITONIN: Procalcitonin: <0.1 ng/mL

## 2013-07-27 LAB — PHOSPHORUS: Phosphorus: 2.6 mg/dL (ref 2.3–4.6)

## 2013-07-27 MED ORDER — POTASSIUM CHLORIDE CRYS ER 20 MEQ PO TBCR
40.0000 meq | EXTENDED_RELEASE_TABLET | Freq: Once | ORAL | Status: AC
  Administered 2013-07-27: 40 meq via ORAL
  Filled 2013-07-27: qty 2

## 2013-07-27 MED ORDER — VITAMIN B-1 100 MG PO TABS
100.0000 mg | ORAL_TABLET | Freq: Every day | ORAL | Status: DC
  Administered 2013-07-27 – 2013-08-07 (×12): 100 mg via ORAL
  Filled 2013-07-27 (×12): qty 1

## 2013-07-27 NOTE — Progress Notes (Signed)
Patient arrived to [facility] room 22 via wheelchair.  Pt was transferred to the bed by NT from dept 57M and bed alarm was placed on patient.  RN checked on patient and attempted to orient her to the room and call bell.  Pt nodded and seems to understand.  No complaints of pain and VSS at this time.
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
## 2013-07-27 NOTE — Progress Notes (Signed)
Neurosurgery and Critical care notified of platelets critical value.
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
## 2013-07-27 NOTE — Progress Notes (Signed)
eLink Physician-Brief Progress Note Patient Name: Nickie RetortMaria Latner DOB: November 23, 1965 MRN: [id number]  Date of Service  07/27/2013   HPI/Events of Note   plt 23k SDH  eICU Interventions  Transfuse 1 u   Intervention Category Intermediate Interventions: Thrombocytopenia - evaluation and management;Bleeding - evaluation and treatment with blood products  Hadli Vandemark V. 07/27/2013, 2:52 AM

## 2013-07-28 ENCOUNTER — Inpatient Hospital Stay (HOSPITAL_COMMUNITY): Payer: Self-pay

## 2013-07-28 LAB — CBC WITH DIFFERENTIAL/PLATELET
Basophils Absolute: 0 10*3/uL (ref 0.0–0.1)
Basophils Relative: 0 % (ref 0–1)
EOS PCT: 4 % (ref 0–5)
Eosinophils Absolute: 0.1 10*3/uL (ref 0.0–0.7)
HCT: 26.9 % — ABNORMAL LOW (ref 36.0–46.0)
Hemoglobin: 9 g/dL — ABNORMAL LOW (ref 12.0–15.0)
Lymphocytes Relative: 12 % (ref 12–46)
Lymphs Abs: 0.2 10*3/uL — ABNORMAL LOW (ref 0.7–4.0)
MCH: 32 pg (ref 26.0–34.0)
MCHC: 33.5 g/dL (ref 30.0–36.0)
MCV: 95.7 fL (ref 78.0–100.0)
MONOS PCT: 16 % — AB (ref 3–12)
Monocytes Absolute: 0.2 10*3/uL (ref 0.1–1.0)
NEUTROS PCT: 68 % (ref 43–77)
Neutro Abs: 0.9 10*3/uL — ABNORMAL LOW (ref 1.7–7.7)
PLATELETS: 18 10*3/uL — AB (ref 150–400)
RBC: 2.81 MIL/uL — AB (ref 3.87–5.11)
RDW: 16.1 % — ABNORMAL HIGH (ref 11.5–15.5)
WBC: 1.4 10*3/uL — CL (ref 4.0–10.5)

## 2013-07-28 LAB — BASIC METABOLIC PANEL
BUN: 3 mg/dL — ABNORMAL LOW (ref 6–23)
CHLORIDE: 104 meq/L (ref 96–112)
CO2: 22 meq/L (ref 19–32)
Calcium: 7.7 mg/dL — ABNORMAL LOW (ref 8.4–10.5)
Creatinine, Ser: 0.54 mg/dL (ref 0.50–1.10)
GFR calc Af Amer: >90 mL/min (ref >90)
GFR calc non Af Amer: >90 mL/min (ref >90)
Glucose, Bld: 136 mg/dL — ABNORMAL HIGH (ref 70–99)
Potassium: 3.6 mEq/L — ABNORMAL LOW (ref 3.7–5.3)
SODIUM: 137 meq/L (ref 137–147)

## 2013-07-28 LAB — URINE CULTURE: Colony Count: >=100000

## 2013-07-28 LAB — PHOSPHORUS: Phosphorus: 1.4 mg/dL — ABNORMAL LOW (ref 2.3–4.6)

## 2013-07-28 LAB — MAGNESIUM: MAGNESIUM: 1.5 mg/dL (ref 1.5–2.5)

## 2013-07-28 LAB — PREPARE PLATELET PHERESIS: Unit division: 0

## 2013-07-28 LAB — PROCALCITONIN

## 2013-07-28 MED ORDER — K PHOS MONO-SOD PHOS DI & MONO 155-852-130 MG PO TABS
500.0000 mg | ORAL_TABLET | Freq: Three times a day (TID) | ORAL | Status: DC
  Administered 2013-07-28 – 2013-08-01 (×11): 500 mg via ORAL
  Filled 2013-07-28 (×17): qty 2

## 2013-07-28 MED ORDER — FENTANYL CITRATE 0.05 MG/ML IJ SOLN
12.5000 ug | INTRAMUSCULAR | Status: DC | PRN
  Filled 2013-07-28: qty 2

## 2013-07-28 MED ORDER — CIPROFLOXACIN IN D5W 400 MG/200ML IV SOLN
400.0000 mg | Freq: Two times a day (BID) | INTRAVENOUS | Status: DC
  Administered 2013-07-28 – 2013-07-30 (×4): 400 mg via INTRAVENOUS
  Filled 2013-07-28 (×5): qty 200

## 2013-07-28 MED ORDER — CLONIDINE HCL 0.1 MG PO TABS
0.1000 mg | ORAL_TABLET | Freq: Three times a day (TID) | ORAL | Status: DC
  Administered 2013-07-28 – 2013-07-30 (×7): 0.1 mg via ORAL
  Filled 2013-07-28 (×11): qty 1

## 2013-07-28 MED ORDER — MAGNESIUM OXIDE 400 (241.3 MG) MG PO TABS
400.0000 mg | ORAL_TABLET | Freq: Two times a day (BID) | ORAL | Status: DC
  Administered 2013-07-28 – 2013-08-07 (×20): 400 mg via ORAL
  Filled 2013-07-28 (×22): qty 1

## 2013-07-28 MED ORDER — MAGNESIUM GLUCONATE 500 MG PO TABS: 500.0000 mg | ORAL_TABLET | Freq: Two times a day (BID) | ORAL | Status: DC
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
## 2013-07-29 LAB — COMPREHENSIVE METABOLIC PANEL
ALBUMIN: 1.7 g/dL — AB (ref 3.5–5.2)
ALK PHOS: 103 U/L (ref 39–117)
ALT: 10 U/L (ref 0–35)
AST: 48 U/L — ABNORMAL HIGH (ref 0–37)
BUN: 5 mg/dL — ABNORMAL LOW (ref 6–23)
CO2: 21 mEq/L (ref 19–32)
Calcium: 7.7 mg/dL — ABNORMAL LOW (ref 8.4–10.5)
Chloride: 105 mEq/L (ref 96–112)
Creatinine, Ser: 0.68 mg/dL (ref 0.50–1.10)
GFR calc Af Amer: >90 mL/min (ref >90)
GFR calc non Af Amer: >90 mL/min (ref >90)
Glucose, Bld: 111 mg/dL — ABNORMAL HIGH (ref 70–99)
POTASSIUM: 3.4 meq/L — AB (ref 3.7–5.3)
SODIUM: 138 meq/L (ref 137–147)
Total Bilirubin: 3.7 mg/dL — ABNORMAL HIGH (ref 0.3–1.2)
Total Protein: 5.6 g/dL — ABNORMAL LOW (ref 6.0–8.3)

## 2013-07-29 LAB — VITAMIN B12: VITAMIN B 12: 1059 pg/mL — AB (ref 211–911)

## 2013-07-29 LAB — PROTIME-INR
INR: 2.53 — ABNORMAL HIGH (ref 0.00–1.49)
PROTHROMBIN TIME: 26.4 s — AB (ref 11.6–15.2)

## 2013-07-29 LAB — MAGNESIUM: MAGNESIUM: 1.3 mg/dL — AB (ref 1.5–2.5)

## 2013-07-29 LAB — APTT: APTT: 42 s — AB (ref 24–37)

## 2013-07-29 LAB — PHOSPHORUS: Phosphorus: 3.7 mg/dL (ref 2.3–4.6)

## 2013-07-29 LAB — AMMONIA: AMMONIA: 103 umol/L — AB (ref 11–60)

## 2013-07-29 MED ORDER — LACTULOSE 10 GM/15ML PO SOLN
30.0000 g | Freq: Three times a day (TID) | ORAL | Status: DC
  Administered 2013-07-29 – 2013-08-07 (×29): 30 g via ORAL
  Filled 2013-07-29 (×40): qty 45

## 2013-07-29 MED ORDER — MAGNESIUM SULFATE 40 MG/ML IJ SOLN
2.0000 g | Freq: Once | INTRAMUSCULAR | Status: AC
  Administered 2013-07-29: 2 g via INTRAVENOUS
  Filled 2013-07-29: qty 50

## 2013-07-29 NOTE — Progress Notes (Signed)
Patient ID: Colleen Greene, female   DOB: 03-Jul-1965, 48 y.o.   MRN: [id number] BP 101/58  Pulse 75  Temp(Src) 97.9 F (36.6 C) (Rectal)  Resp 17  Ht 5' (1.524 m)  Wt 62.4 kg (137 lb 9.1 oz)  BMI 26.87 kg/m2  SpO2 100% There are no current neurosurgical issues. Will sign off. Call if you have questions.
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
## 2013-07-30 DIAGNOSIS — S065XAA Traumatic subdural hemorrhage with loss of consciousness status unknown, initial encounter: Secondary | ICD-10-CM | POA: Diagnosis present

## 2013-07-30 DIAGNOSIS — S065X9A Traumatic subdural hemorrhage with loss of consciousness of unspecified duration, initial encounter: Secondary | ICD-10-CM | POA: Diagnosis present

## 2013-07-30 DIAGNOSIS — I6200 Nontraumatic subdural hemorrhage, unspecified: Secondary | ICD-10-CM

## 2013-07-30 LAB — COMPREHENSIVE METABOLIC PANEL
ALT: 10 U/L (ref 0–35)
AST: 45 U/L — ABNORMAL HIGH (ref 0–37)
Albumin: 1.6 g/dL — ABNORMAL LOW (ref 3.5–5.2)
Alkaline Phosphatase: 95 U/L (ref 39–117)
BUN: 8 mg/dL (ref 6–23)
CALCIUM: 7.2 mg/dL — AB (ref 8.4–10.5)
CO2: 19 mEq/L (ref 19–32)
CREATININE: 0.85 mg/dL (ref 0.50–1.10)
Chloride: 105 mEq/L (ref 96–112)
GFR, EST NON AFRICAN AMERICAN: 79 mL/min — AB (ref >90)
GLUCOSE: 136 mg/dL — AB (ref 70–99)
Potassium: 3.3 mEq/L — ABNORMAL LOW (ref 3.7–5.3)
Sodium: 137 mEq/L (ref 137–147)
Total Bilirubin: 3.5 mg/dL — ABNORMAL HIGH (ref 0.3–1.2)
Total Protein: 5.3 g/dL — ABNORMAL LOW (ref 6.0–8.3)

## 2013-07-30 LAB — CBC
HCT: 26.5 % — ABNORMAL LOW (ref 36.0–46.0)
HEMOGLOBIN: 8.8 g/dL — AB (ref 12.0–15.0)
MCH: 31.9 pg (ref 26.0–34.0)
MCHC: 33.2 g/dL (ref 30.0–36.0)
MCV: 96 fL (ref 78.0–100.0)
Platelets: 24 10*3/uL — CL (ref 150–400)
RBC: 2.76 MIL/uL — ABNORMAL LOW (ref 3.87–5.11)
RDW: 17.1 % — ABNORMAL HIGH (ref 11.5–15.5)
WBC: 2.2 10*3/uL — ABNORMAL LOW (ref 4.0–10.5)

## 2013-07-30 LAB — PHOSPHORUS: Phosphorus: 5.1 mg/dL — ABNORMAL HIGH (ref 2.3–4.6)

## 2013-07-30 LAB — PROTIME-INR
INR: 2.62 — AB (ref 0.00–1.49)
Prothrombin Time: 27.1 seconds — ABNORMAL HIGH (ref 11.6–15.2)

## 2013-07-30 LAB — PATHOLOGIST SMEAR REVIEW

## 2013-07-30 LAB — AMMONIA: AMMONIA: 80 umol/L — AB (ref 11–60)

## 2013-07-30 LAB — MAGNESIUM: Magnesium: 1.7 mg/dL (ref 1.5–2.5)

## 2013-07-30 MED ORDER — POTASSIUM CHLORIDE CRYS ER 20 MEQ PO TBCR
40.0000 meq | EXTENDED_RELEASE_TABLET | Freq: Once | ORAL | Status: AC
  Administered 2013-07-30: 40 meq via ORAL
  Filled 2013-07-30: qty 2

## 2013-07-30 MED ORDER — MAGNESIUM SULFATE 40 MG/ML IJ SOLN
2.0000 g | Freq: Once | INTRAMUSCULAR | Status: AC
  Administered 2013-07-30: 2 g via INTRAVENOUS
  Filled 2013-07-30: qty 50

## 2013-07-30 MED ORDER — PHYTONADIONE 5 MG PO TABS
5.0000 mg | ORAL_TABLET | Freq: Once | ORAL | Status: AC
  Administered 2013-07-30: 5 mg via ORAL
  Filled 2013-07-30: qty 1

## 2013-07-30 MED ORDER — CIPROFLOXACIN HCL 500 MG PO TABS
500.0000 mg | ORAL_TABLET | Freq: Two times a day (BID) | ORAL | Status: DC
  Administered 2013-07-30 – 2013-08-05 (×12): 500 mg via ORAL
  Filled 2013-07-30 (×18): qty 1

## 2013-07-30 NOTE — Clinical Documentation Improvement (Signed)
Possible Clinical Conditions?  Severe Malnutrition   Severe Protein Calorie Malnutrition Other Condition Cannot clinically determine   Risk Factors:(RD's 07/26/13 evaluation): Meets criteria for Severe Malnutrition in the context of chronic illness related to liver disease, as evidenced by severe fat and muscle wasting.  Treatment: Resource Breeze po TID.  Thank You, Marciano SequinWanda Mathews-Bethea,RN,BSN, Clinical Documentation Specialist:  [phone]  [facility]- Health Information Management
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
## 2013-07-30 NOTE — Progress Notes (Signed)
This patient is receiving the antibiotic(s) ciprofloxacin by the intravenous route. Based on criteria approved by the Pharmacy and Therapeutics Committee, and the Infectious Disease Division, the antibiotic(s) is / are being converted to equivalent oral dose form(s). These criteria include:  1) Patient being treated for a respiratory tract infection, urinary tract infection, cellulitis, or Clostridium Difficile Associated Diarrhea 2) The patient is not neutropenic and does not exhibit a GI malabsorption state 3) The patient is eating (either orally or per tube) and/or has been taking other orally administered medications for at least 24 hours. 4) The patient is improving clinically (physician assessment and a 24-hour Tmax of 100.5 F).  If you have questions about this conversion, please contact the pharmacy department.  Thank you.  Vinnie LevelBenjamin Iviona Hole, PharmD.  Clinical Pharmacist Pager [phone]

## 2013-07-31 LAB — CBC
HCT: 25.9 % — ABNORMAL LOW (ref 36.0–46.0)
Hemoglobin: 8.5 g/dL — ABNORMAL LOW (ref 12.0–15.0)
MCH: 31.6 pg (ref 26.0–34.0)
MCHC: 32.8 g/dL (ref 30.0–36.0)
MCV: 96.3 fL (ref 78.0–100.0)
PLATELETS: 26 10*3/uL — AB (ref 150–400)
RBC: 2.69 MIL/uL — ABNORMAL LOW (ref 3.87–5.11)
RDW: 17.2 % — ABNORMAL HIGH (ref 11.5–15.5)
WBC: 1.9 10*3/uL — AB (ref 4.0–10.5)

## 2013-07-31 LAB — PROTIME-INR
INR: 2.67 — ABNORMAL HIGH (ref 0.00–1.49)
Prothrombin Time: 27.5 seconds — ABNORMAL HIGH (ref 11.6–15.2)

## 2013-07-31 LAB — BASIC METABOLIC PANEL
BUN: 10 mg/dL (ref 6–23)
CALCIUM: 7.3 mg/dL — AB (ref 8.4–10.5)
CO2: 19 mEq/L (ref 19–32)
CREATININE: 0.89 mg/dL (ref 0.50–1.10)
Chloride: 110 mEq/L (ref 96–112)
GFR calc non Af Amer: 75 mL/min — ABNORMAL LOW (ref >90)
GFR, EST AFRICAN AMERICAN: 87 mL/min — AB (ref >90)
Glucose, Bld: 105 mg/dL — ABNORMAL HIGH (ref 70–99)
Potassium: 3.6 mEq/L — ABNORMAL LOW (ref 3.7–5.3)
Sodium: 140 mEq/L (ref 137–147)

## 2013-07-31 LAB — MAGNESIUM: MAGNESIUM: 1.8 mg/dL (ref 1.5–2.5)

## 2013-07-31 MED ORDER — CLONIDINE HCL 0.1 MG PO TABS
0.1000 mg | ORAL_TABLET | Freq: Two times a day (BID) | ORAL | Status: DC
  Administered 2013-07-31 – 2013-08-01 (×2): 0.1 mg via ORAL
  Filled 2013-07-31 (×2): qty 1

## 2013-07-31 MED ORDER — MAGNESIUM SULFATE 50 % IJ SOLN
3.0000 g | Freq: Once | INTRAVENOUS | Status: AC
  Administered 2013-07-31: 3 g via INTRAVENOUS
  Filled 2013-07-31: qty 6

## 2013-07-31 MED ORDER — RIFAXIMIN 550 MG PO TABS
550.0000 mg | ORAL_TABLET | Freq: Two times a day (BID) | ORAL | Status: DC
  Administered 2013-07-31 – 2013-08-07 (×15): 550 mg via ORAL
  Filled 2013-07-31 (×17): qty 1

## 2013-07-31 MED ORDER — POTASSIUM CHLORIDE CRYS ER 20 MEQ PO TBCR
40.0000 meq | EXTENDED_RELEASE_TABLET | Freq: Once | ORAL | Status: AC
  Administered 2013-07-31: 40 meq via ORAL
  Filled 2013-07-31: qty 2
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
## 2013-08-01 ENCOUNTER — Inpatient Hospital Stay (HOSPITAL_COMMUNITY): Payer: Self-pay

## 2013-08-01 DIAGNOSIS — M7989 Other specified soft tissue disorders: Secondary | ICD-10-CM | POA: Diagnosis present

## 2013-08-01 DIAGNOSIS — D61818 Other pancytopenia: Secondary | ICD-10-CM | POA: Diagnosis present

## 2013-08-01 LAB — CBC
HCT: 26.2 % — ABNORMAL LOW (ref 36.0–46.0)
Hemoglobin: 8.6 g/dL — ABNORMAL LOW (ref 12.0–15.0)
MCH: 31.7 pg (ref 26.0–34.0)
MCHC: 32.8 g/dL (ref 30.0–36.0)
MCV: 96.7 fL (ref 78.0–100.0)
PLATELETS: 26 10*3/uL — AB (ref 150–400)
RBC: 2.71 MIL/uL — ABNORMAL LOW (ref 3.87–5.11)
RDW: 17 % — ABNORMAL HIGH (ref 11.5–15.5)
WBC: 1.5 10*3/uL — AB (ref 4.0–10.5)

## 2013-08-01 LAB — COMPREHENSIVE METABOLIC PANEL
ALBUMIN: 1.6 g/dL — AB (ref 3.5–5.2)
ALT: 10 U/L (ref 0–35)
AST: 47 U/L — ABNORMAL HIGH (ref 0–37)
Alkaline Phosphatase: 78 U/L (ref 39–117)
BILIRUBIN TOTAL: 3 mg/dL — AB (ref 0.3–1.2)
BUN: 9 mg/dL (ref 6–23)
CHLORIDE: 112 meq/L (ref 96–112)
CO2: 18 mEq/L — ABNORMAL LOW (ref 19–32)
CREATININE: 0.81 mg/dL (ref 0.50–1.10)
Calcium: 7.4 mg/dL — ABNORMAL LOW (ref 8.4–10.5)
GFR, EST NON AFRICAN AMERICAN: 84 mL/min — AB (ref >90)
GLUCOSE: 126 mg/dL — AB (ref 70–99)
Potassium: 3.7 mEq/L (ref 3.7–5.3)
Sodium: 143 mEq/L (ref 137–147)
Total Protein: 5.2 g/dL — ABNORMAL LOW (ref 6.0–8.3)

## 2013-08-01 LAB — CULTURE, BLOOD (ROUTINE X 2)
CULTURE: NO GROWTH
Culture: NO GROWTH

## 2013-08-01 LAB — AMMONIA: Ammonia: 76 umol/L — ABNORMAL HIGH (ref 11–60)

## 2013-08-01 MED ORDER — SPIRONOLACTONE 12.5 MG HALF TABLET
12.5000 mg | ORAL_TABLET | Freq: Every day | ORAL | Status: DC
  Administered 2013-08-02 – 2013-08-03 (×2): 12.5 mg via ORAL
  Filled 2013-08-01 (×3): qty 1

## 2013-08-01 MED ORDER — FUROSEMIDE 20 MG PO TABS
10.0000 mg | ORAL_TABLET | Freq: Every day | ORAL | Status: DC
  Administered 2013-08-02 – 2013-08-03 (×2): 10 mg via ORAL
  Filled 2013-08-01 (×2): qty 0.5

## 2013-08-01 MED ORDER — CLONIDINE HCL 0.1 MG PO TABS
0.1000 mg | ORAL_TABLET | Freq: Every day | ORAL | Status: DC
  Administered 2013-08-02 – 2013-08-07 (×6): 0.1 mg via ORAL
  Filled 2013-08-01 (×6): qty 1
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
## 2013-08-02 LAB — COMPREHENSIVE METABOLIC PANEL
ALK PHOS: 83 U/L (ref 39–117)
ALT: 11 U/L (ref 0–35)
AST: 48 U/L — ABNORMAL HIGH (ref 0–37)
Albumin: 1.8 g/dL — ABNORMAL LOW (ref 3.5–5.2)
BUN: 9 mg/dL (ref 6–23)
CO2: 19 meq/L (ref 19–32)
Calcium: 7.4 mg/dL — ABNORMAL LOW (ref 8.4–10.5)
Chloride: 110 mEq/L (ref 96–112)
Creatinine, Ser: 0.82 mg/dL (ref 0.50–1.10)
GFR calc non Af Amer: 83 mL/min — ABNORMAL LOW (ref >90)
GLUCOSE: 109 mg/dL — AB (ref 70–99)
POTASSIUM: 3.8 meq/L (ref 3.7–5.3)
Sodium: 142 mEq/L (ref 137–147)
Total Bilirubin: 2.9 mg/dL — ABNORMAL HIGH (ref 0.3–1.2)
Total Protein: 5.5 g/dL — ABNORMAL LOW (ref 6.0–8.3)

## 2013-08-02 LAB — PHOSPHORUS: Phosphorus: 3.5 mg/dL (ref 2.3–4.6)

## 2013-08-02 LAB — CBC
HCT: 27.9 % — ABNORMAL LOW (ref 36.0–46.0)
HEMOGLOBIN: 9 g/dL — AB (ref 12.0–15.0)
MCH: 31.3 pg (ref 26.0–34.0)
MCHC: 32.3 g/dL (ref 30.0–36.0)
MCV: 96.9 fL (ref 78.0–100.0)
Platelets: 31 10*3/uL — ABNORMAL LOW (ref 150–400)
RBC: 2.88 MIL/uL — AB (ref 3.87–5.11)
RDW: 16.3 % — ABNORMAL HIGH (ref 11.5–15.5)
WBC: 1.5 10*3/uL — ABNORMAL LOW (ref 4.0–10.5)

## 2013-08-02 LAB — AMMONIA: Ammonia: 59 umol/L (ref 11–60)

## 2013-08-02 LAB — PROTIME-INR
INR: 2.38 — ABNORMAL HIGH (ref 0.00–1.49)
Prothrombin Time: 25.2 seconds — ABNORMAL HIGH (ref 11.6–15.2)

## 2013-08-02 MED ORDER — ENSURE COMPLETE PO LIQD
237.0000 mL | Freq: Two times a day (BID) | ORAL | Status: DC
  Administered 2013-08-02 – 2013-08-07 (×8): 237 mL via ORAL

## 2013-08-02 MED ORDER — BOOST / RESOURCE BREEZE PO LIQD
1.0000 | Freq: Two times a day (BID) | ORAL | Status: DC
  Administered 2013-08-03 – 2013-08-07 (×9): 1 via ORAL
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
## 2013-08-02 NOTE — Clinical Social Work Note (Signed)
Utilizing [employer], CSW attempted to contact pt's next of kin by speaking with both names under pt's demographics.  Belkis Rikki SpearingMaldonado [phone]) Patriciaann ClanIris Villega [phone])  [employer] left a message with both Belkis and Iris requesting to call CSW. CSW will continue to search for pt's next of kin regarding pt's hospitalization and need of placement upon discharge. RN to update CSW if pt has family/visitors at bedside.  Darlyn ChamberEmily [place], LCSWA Clinical Social Worker [phone]
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
## 2013-08-03 ENCOUNTER — Inpatient Hospital Stay (HOSPITAL_COMMUNITY): Payer: Self-pay

## 2013-08-03 LAB — COMPREHENSIVE METABOLIC PANEL
ALT: 11 U/L (ref 0–35)
AST: 39 U/L — ABNORMAL HIGH (ref 0–37)
Albumin: 1.7 g/dL — ABNORMAL LOW (ref 3.5–5.2)
Alkaline Phosphatase: 76 U/L (ref 39–117)
BILIRUBIN TOTAL: 2.9 mg/dL — AB (ref 0.3–1.2)
BUN: 8 mg/dL (ref 6–23)
CHLORIDE: 104 meq/L (ref 96–112)
CO2: 20 meq/L (ref 19–32)
Calcium: 7.3 mg/dL — ABNORMAL LOW (ref 8.4–10.5)
Creatinine, Ser: 0.81 mg/dL (ref 0.50–1.10)
GFR calc Af Amer: >90 mL/min (ref >90)
GFR, EST NON AFRICAN AMERICAN: 84 mL/min — AB (ref >90)
Glucose, Bld: 107 mg/dL — ABNORMAL HIGH (ref 70–99)
Potassium: 3.4 mEq/L — ABNORMAL LOW (ref 3.7–5.3)
Sodium: 135 mEq/L — ABNORMAL LOW (ref 137–147)
Total Protein: 5.5 g/dL — ABNORMAL LOW (ref 6.0–8.3)

## 2013-08-03 LAB — PROTIME-INR
INR: 2.34 — AB (ref 0.00–1.49)
Prothrombin Time: 24.9 seconds — ABNORMAL HIGH (ref 11.6–15.2)

## 2013-08-03 MED ORDER — OXYCODONE HCL 5 MG PO TABS
5.0000 mg | ORAL_TABLET | ORAL | Status: DC | PRN
  Administered 2013-08-03 – 2013-08-07 (×7): 5 mg via ORAL
  Filled 2013-08-03 (×7): qty 1

## 2013-08-03 MED ORDER — FUROSEMIDE 20 MG PO TABS
10.0000 mg | ORAL_TABLET | Freq: Every day | ORAL | Status: DC
  Administered 2013-08-04 – 2013-08-07 (×4): 10 mg via ORAL
  Filled 2013-08-03 (×4): qty 0.5

## 2013-08-03 MED ORDER — PHYTONADIONE 5 MG PO TABS
5.0000 mg | ORAL_TABLET | Freq: Once | ORAL | Status: AC
  Administered 2013-08-03: 5 mg via ORAL
  Filled 2013-08-03: qty 1

## 2013-08-03 MED ORDER — SPIRONOLACTONE 25 MG PO TABS
25.0000 mg | ORAL_TABLET | Freq: Every day | ORAL | Status: DC
  Administered 2013-08-04 – 2013-08-07 (×4): 25 mg via ORAL
  Filled 2013-08-03 (×4): qty 1

## 2013-08-03 NOTE — Progress Notes (Signed)
Clinical Child psychotherapistocial Worker (CSW) utilized [facility] to meet with the patient and her sister at bedside. Patient's sister reported that she would like to take the patient to stay with her in [place]. Sister reported that she does not work and could provide 24 care and support. Sister reported that she would take her home if the MD was in agreement with the plan. Per MD patient could potentially go home depending on how she progresses. SNF search has been initiated and CSW left message with RN case manger to start setting up home health in [place]. CSW also spoke with patient's daughter Denzil Magnusonris over the phone she is in agreement with patient going to stay with her sister in [place]. CSW will continue assist with D/C.   Jetta LoutBailey Morgan, LCSWA Weekend CSW [phone]
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
## 2013-08-04 ENCOUNTER — Inpatient Hospital Stay (HOSPITAL_COMMUNITY): Payer: Self-pay
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
## 2013-08-04 NOTE — Procedures (Signed)
Successful US guided paracentesis from RUQ.  Yielded 3.4 liters of amber colored fluid.  No immediate complications.  Pt tolerated well.   Specimen was not sent for labs.  Berneta LevinsKoreen D Farrie Sann PA-C 08/04/2013 10:53 AM

## 2013-08-04 NOTE — Progress Notes (Signed)
Spoke with pts sister who lives in [place].  She stated that she did not feel comfortable taking pt home with her at this time because she felt she would not be strong enough to help her all the time.  She does not feel that she could physically be able to assist her and pick her up if she were to have a falling episode or near falling.

## 2013-08-05 LAB — COMPREHENSIVE METABOLIC PANEL
ALBUMIN: 1.7 g/dL — AB (ref 3.5–5.2)
ALT: 9 U/L (ref 0–35)
AST: 37 U/L (ref 0–37)
Alkaline Phosphatase: 78 U/L (ref 39–117)
BUN: 8 mg/dL (ref 6–23)
CALCIUM: 7.3 mg/dL — AB (ref 8.4–10.5)
CO2: 18 mEq/L — ABNORMAL LOW (ref 19–32)
CREATININE: 0.77 mg/dL (ref 0.50–1.10)
Chloride: 100 mEq/L (ref 96–112)
GFR calc Af Amer: >90 mL/min (ref >90)
GFR calc non Af Amer: >90 mL/min (ref >90)
Glucose, Bld: 98 mg/dL (ref 70–99)
Potassium: 3.8 mEq/L (ref 3.7–5.3)
Sodium: 131 mEq/L — ABNORMAL LOW (ref 137–147)
Total Bilirubin: 2.9 mg/dL — ABNORMAL HIGH (ref 0.3–1.2)
Total Protein: 5.3 g/dL — ABNORMAL LOW (ref 6.0–8.3)

## 2013-08-05 LAB — CBC
HCT: 27.4 % — ABNORMAL LOW (ref 36.0–46.0)
Hemoglobin: 9.4 g/dL — ABNORMAL LOW (ref 12.0–15.0)
MCH: 31.8 pg (ref 26.0–34.0)
MCHC: 34.3 g/dL (ref 30.0–36.0)
MCV: 92.6 fL (ref 78.0–100.0)
Platelets: 44 K/uL — ABNORMAL LOW (ref 150–400)
RBC: 2.96 MIL/uL — ABNORMAL LOW (ref 3.87–5.11)
RDW: 15.4 % (ref 11.5–15.5)
WBC: 2.1 K/uL — ABNORMAL LOW (ref 4.0–10.5)

## 2013-08-05 LAB — PROTIME-INR
INR: 2.44 — AB (ref 0.00–1.49)
PROTHROMBIN TIME: 25.7 s — AB (ref 11.6–15.2)
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
## 2013-08-05 NOTE — Progress Notes (Signed)
08-05-13 home health orders. Patient was planning on going to stay with her sister Byrd HesselbachMaria [phone]  [place] at discharge. There is a note in EPIC that [facility] nurse last night and she does not feel comfortable taking patient home with her .  Explained this to patient . Patient was unaware . Patient called sister and asked her to call her back directly on hospital room phone to discuss.   Patient ststed her daughter works and can not provide 224 hour assistance . Attempted to call daughter Patriciaann Clanris Villega [phone] , however, received recording she is not accepting phone calls at present and no voice mail option.   Gave patient information on free clinic in [place] , [facility] , in case she returns to [place] . Will [place] clinics in [place] in case she goes there at discharge.    Confirmed with [facility] they do not go [place]. Would be unable to set up home health if patient does go to [place] ( [place] ) , if [place] stays in [place] [facility] could see patient. Patient is going to ask her sister to stay with her at discharge .    Ronny FlurryHeather Hagen Bohorquez RN BSN [phone]
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
## 2013-08-05 NOTE — Progress Notes (Signed)
Seen and agreed 08/05/2013 Rohith Fauth Elizabeth Tamkia Temples PTA [phone] pager [phone] office    
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
## 2013-08-06 MED ORDER — THIAMINE HCL 100 MG PO TABS: 100.0000 mg | ORAL_TABLET | Freq: Every day | ORAL | Status: AC

## 2013-08-06 MED ORDER — ENSURE COMPLETE PO LIQD: 237.0000 mL | Freq: Two times a day (BID) | ORAL | Status: AC

## 2013-08-06 MED ORDER — MAGNESIUM OXIDE 400 (241.3 MG) MG PO TABS: 400.0000 mg | ORAL_TABLET | Freq: Two times a day (BID) | ORAL | Status: AC

## 2013-08-06 MED ORDER — ADULT MULTIVITAMIN W/MINERALS CH: 1.0000 | ORAL_TABLET | Freq: Every day | ORAL | Status: AC

## 2013-08-06 MED ORDER — RIFAXIMIN 550 MG PO TABS: 550.0000 mg | ORAL_TABLET | Freq: Two times a day (BID) | ORAL | Status: AC

## 2013-08-06 MED ORDER — HYDROXYZINE HCL 25 MG PO TABS
25.0000 mg | ORAL_TABLET | Freq: Three times a day (TID) | ORAL | Status: DC | PRN
  Administered 2013-08-06: 25 mg via ORAL
  Filled 2013-08-06: qty 1

## 2013-08-06 MED ORDER — LACTULOSE 10 GM/15ML PO SOLN: 30.0000 g | Freq: Three times a day (TID) | ORAL | Status: DC

## 2013-08-06 MED ORDER — CLONIDINE HCL 0.1 MG PO TABS: 0.1000 mg | ORAL_TABLET | Freq: Every day | ORAL | Status: DC

## 2013-08-06 MED ORDER — FUROSEMIDE 20 MG PO TABS: 10.0000 mg | ORAL_TABLET | Freq: Every day | ORAL | Status: AC

## 2013-08-06 MED ORDER — HYDROXYZINE HCL 25 MG PO TABS: 25.0000 mg | ORAL_TABLET | Freq: Three times a day (TID) | ORAL | Status: AC | PRN

## 2013-08-06 MED ORDER — FOLIC ACID 1 MG PO TABS: 1.0000 mg | ORAL_TABLET | Freq: Every day | ORAL | Status: AC

## 2013-08-06 MED ORDER — OXYCODONE HCL 5 MG PO TABS: 5.0000 mg | ORAL_TABLET | ORAL | Status: DC | PRN

## 2013-08-06 MED ORDER — SPIRONOLACTONE 25 MG PO TABS: 25.0000 mg | ORAL_TABLET | Freq: Every day | ORAL | Status: AC

## 2013-08-06 MED ORDER — BOOST / RESOURCE BREEZE PO LIQD: 1.0000 | Freq: Two times a day (BID) | ORAL | Status: AC
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
## 2013-08-06 NOTE — Clinical Social Work Note (Signed)
[facility]'s family decision on SNF placement for pt at time of discharge. [facility] currently reviewing pt clinical information. [facility] requesting MD discharge summary [including discharge medications] in order to make placement decision. MD notified.  CSW consulting with CSW ChiropodistAssistant Director with assistance in SNF placement.  CSW continuing to follow for SNF placement at time of discharge.   Barriers to discharge: Pt is self-pay [no health insurance], with no SNF benefit.   Darlyn ChamberEmily [place], LCSWA Clinical Social Worker [phone]

## 2013-08-07 DIAGNOSIS — M7989 Other specified soft tissue disorders: Secondary | ICD-10-CM

## 2013-08-07 NOTE — Progress Notes (Signed)
Chart reviewed. Patient examined. Remains medically stable for discharge to SNF.  See Discharge Summary from Dr. Janee Mornhompson 4/14.  Crista Curborinna Tomi Grandpre, M.D.

## 2013-08-07 NOTE — Clinical Social Work Note (Signed)
[facility] ready to [facility] pt. CSW has updated Clydie BraunKaren and Denzil Magnusonris (pt's daughters) regarding discharge. Both daughters in agreement of discharge disposition. CS[facility]. Discharge packet is complete and has been placed on pt's shadow chart. Transportation has been arranged via EMS ([facility]).   RN please call report to [facility] at [phone]  Darlyn ChamberEmily [place], [facility]iesCSWA Clinical Social Worker [phone]

## 2013-08-07 NOTE — Progress Notes (Signed)
Report called to [facility] BraunKaren at [facility] [place].  All questions answered. Awaiting ride from EMS. Vanice Sarahaylor L Thompson

## 2014-08-12 NOTE — Consult Note (Signed)
VSS afebrile, tap results noted, not signif number of PMN cells.  Would continue current meds.  Abd a little less distended, still with umbilical hernia.  Pain in left side of ribs and LUQ when she takes a big breath.  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  Ulcers that size in 2ed portion of duodenum would be extremely rare.  Would continue PPI though.   Electronic Signatures: Scot JunElliott, Robert T (MD)  (Signed on 25-Jul-13 17:17)  Authored  Last Updated: 25-Jul-13 17:17 by Scot JunElliott, Robert T (MD)

## 2014-08-12 NOTE — H&P (Signed)
PATIENT NAME:  Colleen Greene, Colleen Greene MR#:  [id number] DATE OF BIRTH:  Jul 05, 1965  DATE OF ADMISSION:  12/19/2011  PRIMARY CARE PHYSICIAN: [facility].   CHIEF COMPLAINT: Altered mental status, fever and lethargy.   HISTORY OF PRESENT ILLNESS: Colleen Greene is a 49 year old Hispanic female with history of liver cirrhosis and chronic alcoholism. Currently she is DO NOT RESUSCITATE status and recently placed under Hospice care. She is cared for by her two young daughters who apparently are not familiar with her condition and treatment or what to do with medical management. The patient apparently has fever, and the daughters reported to the Emergency Department physician that they put blankets on her. By the time the patient came here, her temperature reached 106.0. Also, we understood that they were not giving her lactulose, and she became encephalopathic. The patient was admitted to the hospital for further treatment, and she was found to have urinary tract infection as the culprit of her fever.   REVIEW OF SYSTEMS: A 10-point system review is unobtainable due to the patient's encephalopathy, and she does not give adequate answers.   PAST MEDICAL HISTORY:  1. 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  2. Chronic alcohol abuse.  3. Pancytopenia.  4. Ascites.  PAST SURGICAL HISTORY: Hysterectomy.   FAMILY HISTORY: Family history is unobtainable from the patient, but per to the records there is no family history of liver cirrhosis and no family history of premature coronary artery disease.   SOCIAL HABITS: Nonsmoker. She was an active alcoholic, both beer and some hard liquor as well. I do not have any information whether she is drinking alcohol these days or not.    ADMISSION MEDICATIONS:   1. Spironolactone 25 mg once a day.  2. Rifaximin 550 mg b.i.d. 3. Omeprazole 20 mg once a day.  4. Nadolol 40 mg once a day. 5. Morphine 20 mg/mL, taking 0.25 mL every 6 hours p.r.n. for pain.  6. Lactulose 30 mL  t.i.d.  7. Furosemide 40 mg b.i.d.   ALLERGIES: Penicillin causing itching. She is also allergic to pork.   SOCIAL HISTORY: The patient lives at home, is cared for by her two young daughters. She is also under care of Hospice.   PHYSICAL EXAMINATION:  VITAL SIGNS: Blood pressure is 109/66, pulse is 94, respiratory rate 18, temperature 100.4 . The temperature at the time of EMS arrival at home was 106.0, and upon coming to the Emergency Department it went down to 103.0. Oxygen saturation 100%.   GENERAL APPEARANCE: A middle-aged female lying in bed in no acute distress, appears lethargic and sluggish to communicate.   HEENT: Head: No pallor. No icterus. No cyanosis. ENT: Hearing was normal. Nasal mucosa, lips, tongue were normal. Eyes: Examination revealed normal eyelids and conjunctiva. Pupils are about 6 mm, equal and reactive to light.   NECK: Supple. Trachea at midline. No thyromegaly. No cervical lymphadenopathy. No masses.   HEART: Exam reveals normal S1, S2. No S3, S4. No murmur. No gallop. No carotid bruits.   RESPIRATORY: Normal breathing pattern without use of accessory muscles. No rales. No wheezing.   ABDOMEN: Soft without tenderness. No hepatosplenomegaly. No masses. No hernias. No signs of ascites.   MUSCULOSKELETAL: No joint swelling. No clubbing.   SKIN: No ulcers. No subcutaneous nodules.  NEUROLOGIC: Cranial nerves II through XII are intact. No focal motor deficit. The patient is encephalopathic, lethargic but alert.  Hands: Flapping tremors were positive.   PSYCHIATRIC: Evaluation is unobtainable due to the patient's encephalopathy.  She does not answer the questions in general, however, every now and then she will focus and will have reasonable answer to the question.   LABORATORY, DIAGNOSTIC AND RADIOLOGICAL DATA: Serum glucose 136, BUN 52, creatinine 1.9, sodium 132, potassium 4.3. Chloride is low at 97. Her ammonia level is elevated at 63.0. Ethanol level less than  3.0. Total protein 6.5, albumin 2.1, bilirubin 4.4, alkaline phosphatase 131, AST 39, ALT 21. Troponin less than 0.02, TSH 3.5. Drug screen was negative. CBC showed white count of 13,000, hemoglobin 12, hematocrit 36, platelet count 31,000. Prothrombin time 23 with INR of 2.1. Urinalysis showed turbid urine with 815 white blood cells, +3 bacteria. Arterial blood gas showed a pH of 7.50, pCO2 21, pO2 131. Bicarbonate 16.0.   ASSESSMENT:  1. Hepatic encephalopathy.  2. Hyperthermia secondary to underlying urinary tract infection and inappropriate management at home. The patient was covered with two blankets. She was sleeping over a waterbed that was warm.  3. Acute renal failure. Her baseline creatinine usually is 0.6 and now it is 1.9.  4. Severe dehydration evident by excessive thirst. The patient is asking to drink water. Her hemoglobin has risen from of 10 to 12. Her BUN/creatinine ratio went up.  5. Urinary tract infection.  6. Alkalosis.  7. Liver cirrhosis.  8. Ascites by history but not this admission. The patient is dehydrated and no evidence of ascites.  9. Chronic alcoholism. 10. Thrombocytopenia.   PLAN:  1. We will admit the patient to the Medical floor. IV antibiotics were started at the Emergency Department using Rocephin and Levaquin. I will continue 1 gram of Rocephin and 250 mg of Levaquin pending results of the urine culture and two blood cultures.  2. Hold the diuretics, both the Lasix and Aldactone, and start IV hydration with normal saline at 100 mL/hr. Follow up on the BUN and creatinine.  3. Monitor the temperature to ensure further improvement.  4. Resume lactulose to treat her encephalopathy.  5. I will hold her antibiotic she takes at home, Rifaximin, since she is going to be on two antibiotics here. This can be resumed upon discharge.  6. Social Services consult to look at the situation at home to ensure that it is appropriate for the patient to go home if she has  adequate care. This also can be arranged with Hospice to keep an eye.   CODE STATUS:  DO NOT RESUSCITATE per her medical records. She was just admitted here less than a month ago. She will continue under the care of Hospice upon discharge.   TIME SPENT:   Time spent in evaluating this patient and reviewing medical records took more than 1 hour.    ____________________________ Carney CornersAmir M. Rudene Rearwish, MD amd:cbb D: 12/19/2011 01:50:16 ET T: 12/19/2011 08:58:21 ET JOB#: [id number]  cc: Carney CornersAmir M. Rudene Rearwish, MD, <Dictator> [facility] Zollie ScaleAMIR M Devinne Epstein MD ELECTRONICALLY SIGNED 12/29/2011 22:21

## 2014-08-12 NOTE — Consult Note (Signed)
NH3 106, WBC 2, plt 30K, abd very distended, bowel sounds present.  Ext no edema, a little more responsive today.  Will increase lactulose and start Xifaxan 550 bid.  Check liver panel tomorrow and met B  Electronic Signatures: Manya Silvas (MD)  (Signed on 24-Jul-13 09:27)  Authored  Last Updated: 24-Jul-13 09:27 by Manya Silvas (MD)

## 2014-08-12 NOTE — H&P (Signed)
PATIENT NAME:  Colleen, Greene MR#:  [id number] DATE OF BIRTH:  May 22, 1965  DATE OF ADMISSION:  11/14/2011  ER REFERRING PHYSICIAN: Dr. Maricela Bo  PRIMARY CARE PHYSICIAN: None. She used to go to [facility] but has not been there in a long time.   CHIEF COMPLAINT: Left upper quadrant abdominal pain.   HISTORY OF PRESENT ILLNESS: Patient is a 49 year old Caucasian female with liver cirrhosis who also has a history of ascites who was followed at [facility] and was getting paracentesis every few months, however, according to patient has not gone there for six months. The ED physician contacted their hepatologist who looked her up on the computer and stated that she has not been there for almost two years. Who presents with complaint of having left upper quadrant pain which she reports that she developed after hitting a table about three days ago. Patient also complains of increase in swelling in the abdomen. She also had some nausea and threw up. She occasionally has diarrhea. She has not had any fevers or chills. She continues to drink on a daily basis. She also complains of easy bruisability. Denies any chest pain or shortness of breath.   PAST MEDICAL HISTORY:  1. Liver cirrhosis due to alcohol abuse.   2. History of ascites requiring paracentesis in the past.  PAST SURGICAL HISTORY: Status post hysterectomy.   ALLERGIES: Allergies to no medications.    HOME MEDICATIONS: 1. Ativan 1 mg b.i.d. as needed.  2. Multivitamins daily.   SOCIAL HISTORY: Does not smoke. Drinks; initially stated that only drinks on weekends but then subsequently her boyfriend stated that she drinks on a daily basis, six beers on daily basis with some hard liquor. Denies any drug use.   FAMILY HISTORY: No history of liver cirrhosis. Denies any coronary artery disease or any other problems.    REVIEW OF SYSTEMS: CONSTITUTIONAL: Complains of no fevers. Complains of fatigue, weakness, abdominal pain. Complains of weight gain due to  worsening ascites, not sure how much. EYES: No blurred or double vision. No pain. No redness. No inflammation. No glaucoma. ENT: No tinnitus. No ear pain. No hearing loss. No seasonal or year-round allergies. RESPIRATORY: No cough. No wheezing. No hemoptysis. CARDIOVASCULAR: No chest pain. No orthopnea. Denies any edema or arrhythmia. GASTROINTESTINAL: Complains of some nausea, vomiting, occasional diarrhea. Complains of abdominal pain. No hematemesis. No melena. No rectal bleeding. GENITOURINARY: Denies any dysuria, hematuria, renal calculus or frequency. ENDO: Denies any polyuria, nocturia, or thyroid problems. Denies any increase in sweating, heat or cold intolerance. HEME/LYMPH: Complains of easy bruisability, not sure of anemia. SKIN: Also has bruising multiple areas of her body. No change in mole, hair or skin. MUSCULOSKELETAL: Denies any pain in neck, back, or shoulder. NEURO: No numbness. No cerebrovascular accident. No transient ischemic attack. No seizures. PSYCHIATRIC: No anxiety. No insomnia. No ADD.   PHYSICAL EXAMINATION:  VITAL SIGNS: Temperature 98.2, pulse 98, respirations 18, blood pressure 145/98, O2 96%.   GENERAL: Patient appears a very ill-appearing Spanish female due to her liver disease.   HEENT: She has scleral icterus. Sclerae is also muddy. Extraocular movements intact. There is no nasal drainage or ulceration. Oropharynx is clear without any exudate or active bleeding.   NECK: No thyromegaly. No carotid bruits.   CARDIOVASCULAR: Regular rate and rhythm. No murmurs, rubs, clicks, or gallops. PMI is not displaced.   LUNGS: Clear to auscultation bilaterally without any rales, rhonchi, or wheezing.   ABDOMEN: Abdomen is very distended with ascites present. Positive  bowel sounds x4. No significant hepatosplenomegaly appreciated because of ascites. She has tenderness in the left upper quadrant. There is no guarding or rebound.   EXTREMITIES: She has no clubbing, cyanosis,  edema.   SKIN: She has multiple areas of bruising in her arms, legs. No other lesions.   MUSCULOSKELETAL: There is no erythema or swelling.   VASCULAR: Good DP, PT pulses.   PSYCHIATRIC: Not anxious or depressed currently. Smells of alcohol.   NEUROLOGICAL: Currently awake, alert, oriented to place, person, and time. Cranial nerves II through XII grossly intact.   LYMPH NODES: No lymph nodes palpable.   LABORATORY, DIAGNOSTIC AND RADIOLOGICAL DATA: Evaluation in the Emergency Department shows a CT scan of the abdomen and pelvis which shows cirrhotic liver, splenomegaly, massive ascites, paraesophageal varices, cholelithiasis, focal outpouching involving the second portion of duodenum with air within it which may represent a duodenal diverticulum versus a duodenal ulcer. INR 1.6, WBC 1.9, hemoglobin 11.5, platelet count 28, glucose 123, BUN 3, creatinine 0.49, sodium 141, potassium 3.0, chloride 107, carbon dioxide 26, calcium 6.7, bilirubin total 2.9, alkaline phosphatase 158, AST 89, albumin 1.9. Urinalysis shows 3+ blood, nitrites negative, leukocyte esterase 2+, bacteria 1+. Alcohol level 0.344.   ASSESSMENT AND PLAN: Patient is a 49 year old Spanish female with alcohol-induced cirrhosis comes in to the ED with left upper quadrant abdominal pain.  1. Abdominal pain possibly due to SBD, also could be due to worsening ascites, also could be related to gastric ulcer/duodenal ulcer. At this time I will place her on IV Protonix b.i.d. GI evaluation, may need endoscopy. Will order a paracentesis. Place her on IV antibiotics with cefotaxime for possible SBP. Will send fluid for further analysis.  2. Questionable duodenal ulcer. Will place her on IV PPIs b.i.d. GI evaluation in the a.m.  3. Liver cirrhosis likely due to alcohol. Will also check a hepatitis panel. Also check alpha-fetoprotein to make sure she does not have underlying liver cancer.  4. Alcohol abuse which is continued. Will place her on  CIWA protocol.  5. Thrombocytopenia, leukopenia, elevated INR all likely due to liver cirrhosis. Based on criteria per radiology may need platelet transfusion prior to the procedure. I will give her a dose of vitamin K for time being.  6. Miscellaneous. Will place her on lower extremity compression stockings for deep vein thrombosis prophylaxis.    TIME SPENT: 40 minutes.   ____________________________ Lacie ScottsShreyang H. Allena KatzPatel, MD shp:cms D: 11/14/2011 20:38:31 ET T: 11/15/2011 06:25:02 ET JOB#: [id number]  cc: Venida Tsukamoto H. Allena KatzPatel, MD, <Dictator> Charise CarwinSHREYANG H Sequoyah Ramone MD ELECTRONICALLY SIGNED 11/22/2011 12:16

## 2014-08-12 NOTE — Consult Note (Signed)
Chief Complaint:   Subjective/Chief Complaint Covering for Dr. Mechele CollinElliott. Lethargic. Daughter available to interpret. Made DNR. Feels better after paracentesis. Eating ok. Some abd pain. LFT overall better though T.bili higher today.   VITAL SIGNS/ANCILLARY NOTES: **Vital Signs.:   26-Jul-13 10:55   Vital Signs Type Post-Procedure   Temperature Temperature (F) 99.5   Celsius 37.5   Temperature Source oral   Pulse Pulse 114   Respirations Respirations 20   Systolic BP Systolic BP 126   Diastolic BP (mmHg) Diastolic BP (mmHg) 77   Mean BP 93   BP Source  if not from Vital Sign Device non-invasive   Pulse Ox % Pulse Ox % 98   Pulse Ox Activity Level  At rest   Oxygen Delivery Room Air/ 21 %   Brief Assessment:   Cardiac Regular    Respiratory clear BS    Gastrointestinal distended. nontender   Lab Results: Routine Coag:  26-Jul-13 07:10    Prothrombin  22.1   INR 1.9 (INR reference interval applies to patients on anticoagulant therapy. A single INR therapeutic range for coumarins is not optimal for all indications; however, the suggested range for most indications is 2.0 - 3.0. Exceptions to the INR Reference Range may include: Prosthetic heart valves, acute myocardial infarction, prevention of myocardial infarction, and combinations of aspirin and anticoagulant. The need for a higher or lower target INR must be assessed individually. Reference: The Pharmacology and Management of the Vitamin K  antagonists: the seventh ACCP Conference on Antithrombotic and Thrombolytic Therapy. Chest.2004 Sept:126 (3suppl): [id number]. A HCT value >55% may artifactually increase the PT.  In one study,  the increase was an average of 25%. Reference:  "Effect on Routine and Special Coagulation Testing Values of Citrate Anticoagulant Adjustment in Patients with High HCT Values." American Journal of Clinical Pathology 2006;126:400-405.)  Routine Hem:  26-Jul-13 07:10    WBC (CBC) 3.6   RBC  (CBC)  3.32   Hemoglobin (CBC)  10.6   Hematocrit (CBC)  30.7   Platelet Count (CBC)  65   MCV 93   MCH 32.0   MCHC 34.6   RDW  17.5   Neutrophil % 83.5   Lymphocyte % 5.9   Monocyte % 6.9   Eosinophil % 3.1   Basophil % 0.6   Neutrophil # 3.0   Lymphocyte #  0.2   Monocyte # 0.2   Eosinophil # 0.1   Basophil # 0.0 (Result(s) reported on 18 Nov 2011 at 08:12AM.)   Radiology Results: US:    26-Jul-13 11:14, US Guided Paracentesis   US Guided Paracentesis    REASON FOR EXAM:    theraputic paracentis/pt has had multiple   paracentisis in past needs large volum  COMMENTS:       PROCEDURE: US  - US GUIDED PARACENTESIS  - Nov 18 2011 11:14AM     RESULT: Ultrasound Guided Paracentesis       Indication:  Patient with end-stage liver disease presents with ascites   refractory to medical management.  Paracentesis is requested.    Comparisons: None    Procedure:     Clinical assessment was performed and informed consent obtained. The   patient was brought to the ultrasound suite and placed in the supine   position. Focused abdominal ultrasound demonstrated a large amount of   ascites. The most accessible pocket was identified in the right lower   quadrant and marked for paracentesis.    A formal timeout procedure was performed according  to departmental   protocol. The abdomen was prepped and draped in the usual sterile manner.   The overlying skin was anesthetized with 8 ml of 1% lidocaine. The   peritoneal cavity was accessed using a 6 [place] Safe-T centesis needle   catheter system and connected to a Vacutainer.    9250 ml of a serosanguineous ascitic fluid was removed.  The drainage   catheter was removed and a sterile dressing placed at the puncture site.   The procedure was well tolerated andwithout complication. Hemostasis was     achieved.    IMPRESSION:     Uncomplicated ultrasound guided paracentesis.    Dictation Site: 1          Verified By: Joellyn Haff, M.D., MD   Assessment/Plan:  Assessment/Plan:   Assessment Alcoholic hepatitis. Lethargic.    Plan Plan for discharge to home soon with hospice. Thanks.   Electronic Signatures: Lutricia Feil (MD)  (Signed 26-Jul-13 13:37)  Authored: Chief Complaint, VITAL SIGNS/ANCILLARY NOTES, Brief Assessment, Lab Results, Radiology Results, Assessment/Plan   Last Updated: 26-Jul-13 13:37 by Lutricia Feil (MD)

## 2014-08-12 NOTE — Discharge Summary (Signed)
PATIENT NAME:  Colleen Greene, Colleen Greene MR#:  [id number] DATE OF BIRTH:  [birth year]  DATE OF ADMISSION:  11/14/2011 DATE OF DISCHARGE:  11/20/2011  DIAGNOSES:  1. Abdominal pain due to worsening ascites.  2. Ascites due to liver cirrhosis induced by alcohol abuse.  3. Questionable history of duodenal ulcer. 4. History of alcohol abuse. 5. Pancytopenia due to liver cirrhosis.  6. Hypokalemia.  7. Child-Pugh Score C liver cirrhosis.   CODE STATUS: DO NOT RESUSCITATE.   DISPOSITION: The patient is being discharged home with hospice.   DIET: Low sodium.   ACTIVITY: As tolerated.   FOLLOWUP: Follow-up with PCP at [facility].   DISCHARGE MEDICATIONS:  1. Bactrim double strength 1 tablet daily.  2. Morphine 20 mg/mL 0.25 mL every six hours p.r.n.  3. Lactulose 30 milliliters t.i.d.  4. Rifaximin 550 mg b.i.d.  5. Omeprazole 20 mg daily.  6. Nadolol 40 mg daily.  7. Spironolactone 25 mg daily.  8. Lasix 40 mg b.i.d.   CONSULTATIONS:  1. Gastroenterology consultation with Dr. Candace Cruise and Dr. Vira Agar.  2. Palliative care consultation with Dr. Ermalinda Memos.  PROCEDURE: Large-volume therapeutic and diagnostic paracentesis. Initially 2 liters were removed. Subsequent paracentesis 9,250 mL fluid was removed.   RESULTS: CT of the abdomen and pelvis: Cirrhotic liver. Massive ascites. Paraesophageal varices. Cholelithiasis. Splenomegaly. Possible duodenal diverticulum versus duodenal ulcer. Hepatitis C was negative. Hepatitis B surface antibody was positive. Hepatitis A antibody was positive. Microbiology: Body fluid culture: No growth in four days. Urine culture negative. Ascitic fluid of abdomen was 0.4, neutrophils 6, macrophages 93. Cell count was 10. INR 1.6 to 1.9. Elevated PT and PTT. Pancytopenia with a white count of 1.6 to 3.2, hemoglobin 10.8, platelet count 23 to 65. Hyperglycemia glucose 123, creatinine 0.55. Low potassium which was supplemented. Elevated liver function tests due to liver cirrhosis.    HOSPITAL COURSE: The patient is a 49 year old female with past medical history of alcohol abuse, liver cirrhosis with history of ascites who used to follow up at [facility] and was getting paracentesis every few months. The patient had not gone there for the last six months. As per review of records, the patient had not gone to [facility] for the last two years. She was admitted with left upper quadrant pain and increasing abdominal swelling. Initially, there was a concern for possible spontaneous bacterial peritonitis with worsening ascites. Therefore, the patient was admitted to the hospital and started on empiric antibiotics. Therapeutic/diagnostic tap of two liters was done. The cell count was not consistent with FBP. Peritoneal fluid cultures were also negative. A CT of the abdomen showed possible duodenal ulcer versus duodenal diverticulum. A gastroenterology consultation was obtained. No plans for luminal evaluation was made given the patient's poor condition. She was empirically treated with PPI. She had a history of ongoing alcohol abuse and was placed on CIWA protocol. She had chronic pancytopenia with coagulopathy due to her underlying liver disease. A palliative consultation was obtained. After much consideration, the patient's family decided on DO NOT RESUSCITATE and discharged home with hospice. There are aware that the patient's overall prognosis is guarded. She is Child-Pugh score of C with active drinking. The patient was aggressively counseled about alcohol cessation. She is being discharged home in a stable condition with hospice.   TIME SPENT: 45 minutes.  ____________________________ Cherre Huger, MD sp:ap D: 11/20/2011 13:59:56 ET             T: 11/21/2011 12:31:29 ET  JOB#: [id number] cc: Cherre Huger, MD, <Dictator> [place] MD ELECTRONICALLY SIGNED 11/21/2011 13:06

## 2014-08-12 NOTE — Discharge Summary (Signed)
PATIENT NAME:  Colleen Greene, Colleen Greene MR#:  [id number] DATE OF BIRTH:  [birth year]  DATE OF ADMISSION:  12/19/2011 DATE OF DISCHARGE:  12/23/2011  DISCHARGE DIAGNOSES: 1. Sepsis (Extended-spectrum beta-lactamase Escherichia coli) positive blood and urine culture, on Invanz, improving.  2. Encephalopathy, metabolic, likely due to sepsis and/or hepatic encephalopathy, improving. 3. Cirrhosis with hepatic encephalopathy, started on Xifaxan and lactulose, slowly improving. Cannot tolerate nadolol due to hypotension.  4. Acute renal failure, resolved with hydration.   SECONDARY DIAGNOSES:  1. History of alcoholism and liver cirrhosis.  2. Chronic alcohol abuse. 3. Pancytopenia. 4. Ascites.  CONSULTANTS: Festus Barren, MD - Vascular Surgery.  PROCEDURES/RADIOLOGY: PICC line placement on 12/22/2011 by Dr. Wyn Quaker for long-term IV antibiotics.   CT scan of the head without contrast on 12/18/2011 showed no acute intracranial process.   Chest x-ray on 12/18/2011 showed possible atelectasis.  CT scan of the head without contrast on 12/20/2011 showed no acute intracranial abnormality.   MAJOR LABORATORY PANEL: Urinalysis on admission showed 815 WBCs and 3+ leukocyte esterase and WBC clumps present along with 3+ bacteria.   Blood cultures: Four out of four grew Escherichia coli ESBL.   Urine culture also grew the same strain of Escherichia coli with ESBL variety.  HISTORY AND SHORT HOSPITAL COURSE: The patient is a 49 year old female with the above-mentioned medical problems who was admitted for sepsis. Please see Dr. Riley Nearing dictated history and physical for further details. She was started on IV Rocephin and Levaquin with source of sepsis being possible urinary. The patient was growing ESBL Escherichia coli in the urine and also same in the blood. Her antibiotic was switched to Invanz based on sensitivity. She was also found to have hepatic encephalopathy and was started on Rifaximin and lactulose. She could not  tolerate nadolol or any other blood pressure medication. Her mental status was slowly improving on ongoing regimen of IV antibiotics and Rifaximin. On 12/22/2011, vascular surgery, Dr. Wyn Quaker, place a PICC line catheter for long-term IV antibiotics, as the patient will need a total of 14 days of IV antibiotic and she was getting close to her baseline and was discharged home on 12/23/2011 under hospice care.   DISCHARGE PHYSICAL EXAMINATION:   VITALS: On the date of discharge, her temperature was 98, heart 94 per minute, respiration 18 per minute, blood pressure 118/78 mmHg, and she was saturating 100% on 2 liters oxygen by nasal cannula.  CARDIOVASCULAR: S1 and S2 normal. No murmurs, rubs, or gallops.   LUNGS: Clear to auscultation bilaterally. No wheezing, rales, rhonchi, or crepitation.   ABDOMEN: Soft and benign.   NEURO: Flapping tremor positive, otherwise nonfocal.  She was getting much more alert on the date of discharge. All other physical examination remained at baseline.   DISCHARGE MEDICATIONS:  1. Morphine 0.25 mL every 6 hours p.r.n. 2. Lactulose 30 mL p.o. three times daily. 3. Rifaximin 50 mg p.o. twice a day. 4. Omeprazole 20 mg p.o. daily.  5. Invanz 1 gram IV daily for 10 more days.   DISCHARGE DIET: Regular.  DISCHARGE ACTIVITY: As tolerated.   DISCHARGE INSTRUCTIONS AND FOLLOW-UP: The patient was instructed to follow up with her primary care physician in 1 to 2 weeks. She was instructed to stop taking spironolactone, nadolol, Lasix, and Bactrim. She was set up to get hospice services at home.   TOTAL TIME DISCHARGING THIS PATIENT: 55 minutes.  ____________________________ Ellamae Sia. Sherryll Burger, MD vss:slb D: 12/23/2011 23:47:14 ET T: 12/27/2011 10:44:46 ET JOB#: [id number]  cc: Deondre Marinaro S. Sherryll Burger,  MD, <Dictator> [facility]CP - [facility] Provider Ellamae SiaVIPUL S [facility] MD ELECTRONICALLY SIGNED 12/27/2011 14:37

## 2014-08-12 NOTE — Consult Note (Signed)
Pt with alcohol cirrhosis with classical findings including enlarged spleen, low platelets, low albumin. Exam shows large spleen and umbilical hernia.   No specific treatment other than usual protocol. Given very low plt ct would not do paracentesis at this time.  Will follow with you.  Electronic Signatures: Scot JunElliott, Sierria Bruney T (MD)  (Signed on 23-Jul-13 17:42)  Authored  Last Updated: 23-Jul-13 17:42 by Scot JunElliott, Valencia Kassa T (MD)

## 2014-08-12 NOTE — Consult Note (Signed)
PATIENT NAME:  Colleen Greene, Nazyia MR#:  [id number] DATE OF BIRTH:  11-Jan-1966  DATE OF CONSULTATION:  11/15/2011  REFERRING PHYSICIAN:  S. Allena KatzPatel, MD  CONSULTING PHYSICIAN:  Lynnae Prudeobert Elliott, MD/Micai Apolinar A. Arvilla MarketMills, ANP  REASON FOR CONSULTATION: Ascites, abdominal pain, and cirrhosis.   HISTORY OF PRESENT ILLNESS: This 49 year old female came into the hospital with abdominal pain, increasing swelling of the abdomen, and nausea. She has a history of alcoholic cirrhosis and [facility] had [facility]. Family reports that patient has been off her medications for some time. The patient is unable to give a good history as she is lethargic. The patient continues to drink alcohol daily. The family states that they have been trying to convince her to quit drinking alcohol without success. Her daughter reports the patient is not involved in any type of outpatient treatment counseling because she has refused.   PAST MEDICAL HISTORY:  1. Liver cirrhosis secondary to alcohol abuse.  2. History of ascites requiring paracentesis in the past.   PAST SURGICAL HISTORY: Hysterectomy.   HOME MEDICATIONS: The patient denies taking any.   ALLERGIES: No known drug allergies.   HABITS: Negative tobacco. Drinks on a daily basis, six beers a day with some hard liquor. Denies illicit drug use.   FAMILY HISTORY: Negative for colon cancer or GI malignancy.  REVIEW OF SYSTEMS: The patient is lethargic, arouses to voice, answers a few questions minimally. She has reported weight gain, fatigue. No fevers. Positive abdominal pain left upper quadrant area, duration unknown. Occasional diarrhea. No constipation, blood, or melena. The patient falls asleep. Review of systems unable to be accurately obtained.     PHYSICAL EXAMINATION:   VITAL SIGNS: Temperature 98.1, heart rate 98, respiratory rate 21, blood pressure 159/84, 95% on room air.   GENERAL: The patient is a Hispanic female, cachectic,  massive abdomen size, lethargic and appears ill appearing, disheveled.   HEENT: Head is normocephalic. Positive sclerae icterus. Oral mucosa is dry. No active bleeding noted.   NECK: Supple. Trachea midline. No thyromegaly.   CARDIAC: S1 and S2 without murmur or gallop.   LUNGS: Clear to auscultation. Respirations are hazy, nonlabored.   ABDOMEN: Abdomen is very distended, out of proportion to her body habitus. She has a large umbilical hernia. Positive bowel sounds. Positive tenderness. Huge spleen palpable. She has ascites. There is no guarding or rebound.   RECTAL: Deferred.   EXTREMITIES: Without edema, cyanosis, or clubbing.   SKIN: Multiple bruising noted. No skin rash.   MUSCULOSKELETAL: Muscle wasting noted. The patient is unable to sit up on her own in bed. Strength is weak bilaterally.   PSYCHIATRIC: The patient is sleeping. Affect is flat when awake. Appears drugged. Falls immediately back to sleep. Poor historian. Cooperative.   NEUROLOGIC: The patient is aware of family members, knows names, place, is cooperative.   LABORATORY, DIAGNOSTIC, AND RADIOLOGICAL DATA: Admitting blood work 11/14/2011 glucose 123, BUN 3, creatinine 0.49. Lipase 189. Ethanol 344. Albumin 1.9, total bilirubin 2.9, alkaline phosphatase 158, AST 89, ALT 18, WBC 1.9, hemoglobin 11.5, platelet count 28. Pro-time 19.7. INR 1.6. PTT 39.5. Urinalysis positive for 3+ blood, leukocyte esterase, 1+ bacteria.   Repeat laboratory studies with glucose 112, BUN 3, creatinine 0.55, potassium 3.2, calcium 6.7. Magnesium 1.2. Ammonia 106. Hemoglobin 10.8, platelet count 23, WBC 1.6.   CT scan of the abdomen and pelvis with contrast performed 11/14/2011 in evaluation of abdominal pain showed clear lung base, massive ascites, liver diminutive in  size with micronodular contour consistent with cirrhosis. Positive splenomegaly. 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 Recanalization of the umbilical  vein. Umbilical hernia. Bilateral fluid containing inguinal hernias. Evaluation of the bowel was limited to lack of enteric contrast. No bowel obstruction. There is focal outpouching involving the second portion of the duodenum with air within it. May represent duodenal diverticulum versus duodenal ulcer. Correlate with clinical exam recommended.   IMPRESSION: This is a cirrhotic liver mass with classical findings end-stage. The patient has massive ascites. The platelet count is too low at this time for paracentesis. She has pancytopenia, elevated ammonia level, splenomegaly, varices, and abdominal pain secondary to the splenomegaly.   PLAN:  1. Routine treatment for cirrhosis of the liver recommended.  2. Paracentesis when medically feasible.  3. Treatment for possible duodenal ulcer versus duodenal diverticulum per CT report recommended with Protonix IV injection q.12 hours.  4. The patient is on lactulose, may need additional Xifaxan.  5. Platelets have been ordered by attending physician group and paracentesis to be considered when medically feasible.  6. Family is asking questions regarding the nature of the patient's problem. The daughter and sister appear unaware of the severity of her liver disease. We discussed this today. Palliative Care consult noted. The daughter is interested in trying to get patient into some type of a rehab so that she can stop drinking and improve her health.   This case was discussed with Dr. Mechele Collin in collaboration of care.      These services provided by Cala Bradford A. Arvilla Market, ANP under collaborative agreement with Lynnae Prude, MD.   ____________________________ Ranae Plumber. Arvilla Market, ANP kam:drc D: 11/16/2011 08:40:11 ET T: 11/16/2011 09:00:22 ET JOB#: [id number]  cc: Cala Bradford A. Arvilla Market, ANP, <Dictator> Ranae Plumber. Suzette Battiest, MSN, ANP-BC Adult Nurse Practitioner ELECTRONICALLY SIGNED 11/22/2011 16:19

## 2014-08-12 NOTE — Consult Note (Signed)
  Electronic Signatures: Borders, Daryl EasternJoshua R (NP)  (Signed 20-Sep-13 15:56)  Authored: Palliative Care   Last Updated: 20-Sep-13 15:56 by Malachy MoanBorders, Joshua R (NP)

## 2014-08-12 NOTE — Consult Note (Signed)
    Comments   Came by to see patient. She out of room getting paracentesis. Will followup tomorrow.   Electronic Signatures: Oda Placke, Daryl EasternJoshua R (NP)  (Signed 24-Jul-13 16:36)  Authored: Palliative Care   Last Updated: 24-Jul-13 16:36 by Malachy MoanBorders, Graeden Bitner R (NP)

## 2014-08-12 NOTE — Op Note (Signed)
PATIENT NAME:  Colleen Greene, [name] MR#:  [id number] DATE OF BIRTH:  January 23, 1966  DATE OF PROCEDURE:  12/22/2011  PREOPERATIVE DIAGNOSES:  1. Dehydration. 2. Hepatic encephalopathy.  3. Cirrhosis.  4. Poor venous access.   POSTOPERATIVE DIAGNOSES:  1. Dehydration. 2. Hepatic encephalopathy.  3. Cirrhosis.  4. Poor venous access.   PROCEDURES:  1. Ultrasound guidance for vascular access to right basilic vein.  2. Fluoroscopic guidance for placement of catheter.  3. Insertion of peripherally inserted central venous catheter, right arm.  SURGEON: Annice NeedyJason S. Raechel Marcos, MD   ANESTHESIA: Local.   ESTIMATED BLOOD LOSS: Minimal.   INDICATION FOR PROCEDURE: This is a 49 year old Hispanic female with severe cirrhosis. She is admitted with hepatic encephalopathy and dehydration. She needs PICC line for fluid rehydration and other medications.   DESCRIPTION OF PROCEDURE: The patient's right arm was sterilely prepped and draped, and a sterile surgical field was created. The right basilic vein was accessed under direct ultrasound guidance without difficulty with a micropuncture needle and permanent image was recorded. 0.018 wire was then placed into the superior vena cava. Peel-away sheath was placed over the wire. A single lumen peripherally inserted central venous catheter was then placed over the wire and the wire and peel-away sheath were removed. The catheter tip was placed into the superior vena cava and was secured at the skin at 33 cm with a sterile dressing. The catheter withdrew blood well and flushed easily with heparinized saline. The patient tolerated procedure well.  ____________________________ Annice NeedyJason S. Cyndi Montejano, MD jsd:drc D: 12/22/2011 15:44:36 ET T: 12/22/2011 17:46:16 ET JOB#: [id number]  cc: Annice NeedyJason S. Domnick Chervenak, MD, <Dictator> Annice NeedyJASON S Brock Mokry MD ELECTRONICALLY SIGNED 12/28/2011 10:02

## 2014-08-12 NOTE — H&P (Signed)
PATIENT NAME:  Colleen Greene, Colleen Greene MR#:  [id number] DATE OF BIRTH:  [birth year]  DATE OF ADMISSION:  01/12/2012  PRIMARY CARE PHYSICIAN: None  ER PHYSICIAN: Dr. Olivia Mackie  ADMITTING PHYSICIAN: Dr. Tilda Franco   PRESENTING COMPLAINT: Abdominal distention and pain.   HISTORY OF PRESENT ILLNESS: The patient is a 49 year old lady with known liver cirrhosis and massive ascites who presented with the complaint of abdominal distention, increasing fatigue, and shortness of breath on mild exertion due to the ascites. For this she was seen in the Emergency Room, complaining of some abdominal tenderness but no fever, no loss of consciousness. No PND, orthopnea, or pedal edema. No confusion or altered mental status. For this she was referred to the hospitalist service for further evaluation. Of note, the patient has  prior history of thoracentesis, is currently on lactulose and rifaximin which she is currently taking.      REVIEW OF SYSTEMS:  CONSTITUTIONAL: Denies fever. Admits to fatigue and weight gain. EYES: No blurred vision, redness, or discharge. ENT: No tinnitus, epistaxis, or difficulty swallowing. RESPIRATORY: Denies any cough or shortness of breath. CARDIOVASCULAR: No chest pain, palpitations, or syncope. GI: No nausea, vomiting, or diarrhea. Has some abdominal pain which is generalized and sharp in nature, intensity 2/10. No change in bowel habits. Has abdominal distention. GU: No dysuria, frequency, or incontinence. ENDOCRINE: No polyuria, polydipsia, or heat or cold intolerance. HEMATOLOGIC: No anemia, easy bruising, bleeding, or swollen glands. SKIN: No rashes or change in hair or skin texture.  MUSCULOSKELETAL: No joint pain, redness, swelling, or limited activity. NEURO: No numbness, vertigo, confusion, memory loss, or migraine. PSYCH: No anxiety or depression.   PAST MEDICAL HISTORY:  1. History of chronic alcohol abuse with liver cirrhosis secondary to alcohol abuse.  2. History of ascites requiring  paracentesis in the past. 3. History of chronic pancytopenia, anemia. 4. Prior episodes of hepatic encephalopathy.    PAST SURGICAL HISTORY: 1. Hysterectomy.  2. Paracentesis.     SOCIAL HISTORY: Currently lives at home with her two daughters, currently on Hospice care at home. Reformed alcoholic, quit about six months ago. Denies any other recreational drug use.   FAMILY HISTORY: Denies any family history of coronary artery disease.   ALLERGIES: Penicillin.   MEDICATIONS:  1. Morphine 0.25 mL q. 6 p.r.n. for pain.  2. Lactulose 10 grams 3 times daily.  3. Rifaximin  550 mg twice daily.  4. Omeprazole 20 mg daily.  5. Ciprofloxacin 500 mg twice daily Mondays, Wednesdays, and Fridays.   PHYSICAL EXAMINATION:  VITAL SIGNS: Temperature 97.9, pulse 82, respiratory rate 18, blood pressure 120/73, oxygen sat 100% on room air.   GENERAL: Colleen Greene is a Spanish lady looking older than her stated age, lying on the gurney in a lateral position in no obvious respiratory distress.   HEENT: Atraumatic, normocephalic. Pupils equal, reactive to light and accommodation. Extraocular movements intact. Mucous membranes pink, dry.   NECK: Supple. No JV distention.   CHEST: Shallow respiratory effort. No rhonchi. No rales.   HEART: Regular rate and rhythm. No murmur.   ABDOMEN: Massively distended.  Ascites with spider navea. No abdominal tenderness. Bowel sounds hypoactive.   EXTREMITIES: Generalized muscle wasting.   NEUROLOGICAL: Cranial nerves II through XII grossly intact. No asterixis.   PSYCH: Affect appropriate to situation.   LABORATORY, DIAGNOSTIC, AND RADIOLOGICAL DATA: There is no EKG.  CBC showed white count 2.3, down from 9.9 from one month ago, hemoglobin 9.9 down from 11 from one month ago, platelets  34, up from 21 from a month ago. Chemistry unremarkable except for low potassium of 3, down from 3.6, calcium of 7.5, creatinine 0.6, BUN 7, glucose 116, anion gap 10, lipase 271,  albumin 1.8, total bilirubin 3.3. Alkaline phosphatase 160. Urinalysis showed negative nitrite, leukocyte esterase 2+, WBC 28, no bacteria, epithelial cells 5.   IMPRESSION:  1. Massive ascites secondary to #2. 2. Liver cirrhosis secondary to alcohol abuse.  3. Pancytopenia most likely related to chronic liver disease.  4. Urinary tract infection.   PLAN:  1. Admit to the general medical floor under observation for ultrasound-guided paracentesis in the a.m. Comfort measures only.  2. Obtain blood culture, urine culture to further isolate urinary tract infection. The patient is on chronic antibiotics.  3. GI prophylaxis with omeprazole.  4. Deep vein thrombosis prophylaxis with subcutaneous heparin. 5. Anticipate discharge following paracentesis. 6. The patient was discussed with Dr. Niel HummerIftikhar, GI on call, by the ER physician.  7. CODE STATUS: DO NOT RESUSCITATE.   TOTAL PATIENT CARE TIME: 50 minutes.   ____________________________ Colleen SabinaMarcel I. Tilda FrancoAkuneme, MD mia:bjt D: 01/11/2012 23:47:08 ET T: 01/12/2012 09:01:43 ET JOB#: [id number]  cc: Colin Ellers I. Tilda FrancoAkuneme, MD, <Dictator> Margaret PyleMARCEL I Rease Swinson MD ELECTRONICALLY SIGNED 01/13/2012 2:35

## 2014-08-12 NOTE — Consult Note (Signed)
    Comments   Daughters did not come to the hospital for scheduled meeting. Number listed in the chart went unanswered and unable to leave VM. Pt does not know daughter's numbers.  is s/p high volume paracentesis with 12L removed. She says she feels better. Note plan for discharge today home with hospice. Will schedule followup for clinic appointment.     Electronic Signatures: Borders, Daryl EasternJoshua R (NP)  (Signed 20-Sep-13 15:56)  Authored: Palliative Care   Last Updated: 20-Sep-13 15:56 by Malachy MoanBorders, Joshua R (NP)

## 2014-08-12 NOTE — Discharge Summary (Signed)
PATIENT NAME:  Colleen Greene, Miyo MR#:  [id number] DATE OF BIRTH:  11/19/1965  DATE OF ADMISSION:  01/12/2012 DATE OF DISCHARGE:  01/13/2012  HISTORY AND PHYSICAL: For a detailed note, please take a look at the History and Physical done on admission Dr. Gaynelle CageMarcel Akuneme.   DISCHARGE DIAGNOSES:  1. Abdominal pain and distention secondary to tense ascites.  2. 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 3. Pancytopenia.  4. Urinary tract infection.   DIET: The patient is being discharged on a regular diet.   ACTIVITY: As tolerated.   FOLLOWUP: Followup is with Dr. Harriett SineNancy Phifer [facility] in the next 1 to 2 weeks.   DISCHARGE MEDICATIONS:  1. Lactulose 30 mL t.i.d.  2. Rifaximin 550 mg b.i.d.  3. Omeprazole 20 mg daily.  4. Ciprofloxacin 5 mg, 1 tab every 12 hours Monday, Wednesday and Friday. 5. Morphine 20 mg/0.25 mL every 6 hours as needed for pain.   BRIEF HOSPITAL COURSE: The patient is a 49 year old female with medical problems as mentioned above, presented to the hospital with abdominal pain and distention secondary to tense ascites.   1. Abdominal pain/distention: This is secondary to tense ascites. The patient has significant history of end-stage liver disease secondary to alcohol abuse. She is followed by Hospice but came into the hospital for a therapeutic/palliative paracentesis. She was noted to be severely thrombocytopenic and, therefore, was transfused 2 units of packed platelets. Her platelets did come up a little bit, and she underwent an ultrasound-guided paracentesis and removal of 12 liters of fluid. She clinically feels much better and, therefore, is being discharged home. She is already followed by Hospice, and they will try to arrange for her to have paracentesis as needed with them as an outpatient if this were to recur again so she does not have to be readmitted.  2. Pancytopenia: This is likely secondary to her chronic liver disease.  As mentioned, she was transfused 2 units of packed platelets.  She needed the procedure to get her paracentesis done. Her last platelet count is 44,000.  3. History of chronic liver disease secondary to alcohol abuse: The patient will resume her maintenance medications, including the lactulose, Xifaxan and morphine as needed for pain control. She will also continue her Cipro Monday, Wednesday, Friday for prophylactic treatment of a spontaneous bacterial peritonitis.    CODE STATUS: The patient currently is a DO NOT INTUBATE, DO NOT RESUSCITATE.   DISPOSITION: She is being discharged home with Hospice Services.    TIME SPENT: 30 minutes.  ____________________________ Rolly PancakeVivek J. Cherlynn KaiserSainani, MD vjs:cbb D: 01/13/2012 16:22:29 ET T: 01/16/2012 12:16:59 ET JOB#: [id number]  cc: Rolly PancakeVivek J. Cherlynn KaiserSainani, MD, <Dictator> Ned GraceNancy Phifer, MD Houston SirenVIVEK J Kaliya Shreiner MD ELECTRONICALLY SIGNED 01/20/2012 12:16

## 2014-08-15 NOTE — Discharge Summary (Signed)
PATIENT NAME:  Colleen Greene, Colleen Greene MR#:  [id number] DATE OF BIRTH:  09-02-1965  DATE OF ADMISSION:  12/21/2012 DATE OF DISCHARGE:  12/26/2012  ADMITTING DIAGNOSIS: Abdominal pain.   DISCHARGE DIAGNOSES:  1.  Abdominal pain, possibly related to spontaneous bacterial peritonitis. Attempt at paracentesis was done by radiology and there was not enough fluid to be drawn. The patient's abdominal pain now much improved.  2.  Reducible umbilical hernia. The patient, unless he starts getting a lot of issues, is not a candidate for surgical intervention.  3.  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  4.  Electrolyte imbalances, including hypomagnesemia and hypokalemia, status post replacement.  5.  Ascites, status post multiple paracentesis.  6.  History of hepatic encephalopathy, status post bilateral tubal ligation.  7.  Status post evacuation of a hematoma of the right lower extremity under local anesthesia.  PERTINENT LABS AND EVALUATIONS: Admitting glucose 97, BUN 8, creatinine 0.80, sodium 137, potassium 3.3, chloride 107, CO2 was 23, calcium 7.5, magnesium was 1.2 on 08/30. LFTs showed a total protein 6.1, albumin of 1.9, bilirubin total 2.9. WBC 2.2, hemoglobin 10.0, platelet count was 39. Platelet count on 09/02 was 30. INR was 2.0. INR on 09/03 was 1.7. CT of the head without contrast showed atrial fib. with chronic microvascular changes. Ultrasound guided paracentesis showed miniscule ascites noted.   HOSPITAL COURSE: Please refer to H and P done by the admitting physician. The patient is a 49 year old Spanish female, who has been admitted previously with similar type of presentation in the past and presented with abdominal pain and distention. The patient came to the hospital and with her abdominal pain she was also noted to have umbilical hernia that was reducible. The patient was initially thought to have spontaneous  bacterial peritonitis and she was started on IV antibiotics with Levaquin. Due to her INR being high and platelets being low, she could not have paracentesis. Finally, she received vitamin K and then FFPs were given. Her INR became low enough that she was able to go to radiology for paracentesis; however, there was not much fluid. Her abdominal pain is now resolved and she is doing much better and is anxious to go home.   DISCHARGE MEDICATIONS: Lasix 40 mg 1 tab p.o. b.i.d., nadolol 40, 1 tab p.o. b.i.d., tramadol p.r.n., spironolactone 25 p.o. daily, lactulose 30 mL b.i.d., levofloxacin 750, 1 tab p.o. q.24 hours x 3 days and ranitidine 150, 1 tab p.o. q.12 hours.   DIET: Low sodium.   ACTIVITY: As tolerated.   FOLLOWUP: With primary M.D. in 1 to 2 weeks.   TIME SPENT: 35 minutes spent on this discharge.   ____________________________ Lacie ScottsShreyang H. Allena KatzPatel, MD shp:aw D: 12/27/2012 [id number] ET T: 12/27/2012 08:41:46 ET JOB#: [id number]  cc: Euretha Najarro H. Allena KatzPatel, MD, <Dictator> Charise CarwinSHREYANG H Shamaria Kavan MD ELECTRONICALLY SIGNED 01/05/2013 14:13

## 2014-08-15 NOTE — H&P (Signed)
PATIENT NAME:  Colleen Greene, Colleen Greene MR#:  [id number] DATE OF BIRTH:  [birth year]  DATE OF ADMISSION:  12/21/2012  REFERRING PHYSICIAN: Eartha Inch. York Cerise, MD  PRIMARY CARE PHYSICIAN: The patient does not have one. She occasionally sees Dr. Mechele Collin from GI.  CHIEF COMPLAINT:  1.  Abdominal pain and distention.  2.  Umbilical hernia.   HISTORY OF PRESENT ILLNESS: This is a very nice 49 year old female who was admitted here in 12/2011 with abdominal pain and distention, and at that moment she was diagnosed with end-stage liver disease, discharged with hospice. Apparently she has been taken off hospice and she has not needed any abdominal paracentesis in over 6 months. The patient comes today complaining of abdominal pain, distention, that has been going on for several months now but getting worse within the last month. Apparently, she is getting weaker, not being able to walk around much because she gets short of breath and because her abdomen is really heavy. She is starting to notice an umbilical hernia getting distended within the last 1 month. She has abdominal pain which is 7 out of 10, sharp, abdominal distention due to ascites, protruding hernia on the umbilical scar. The pain is not radiating. The pain is not better with any changes of position. The pain is worse whenever she moves around. She also has pain located on the left rib at the level of the left upper quadrant, and it seems like the pain is also 7 out of 10 and is very sharp. She has occasional edema of the lower extremity, but not today. She states that she has been off her medications. Only the furosemide she is taking because it is the only thing that she has. She does not have a primary care physician and we need to find her one.   REVIEW OF SYSTEMS: A 12-system review of systems is done.  CONSTITUTIONAL: The patient states that she occasionally has fevers, but she cannot quantify them. She does not own a thermometer. She is weak and fatigued.   EYES: No blurry vision, double vision or redness.  EARS, NOSE, THROAT: No tinnitus, epistaxis or difficulty swallowing.  RESPIRATORY: No cough. Positive shortness of breath. Negative sputum.  CARDIOVASCULAR: No chest pain, palpitations, syncope or orthopnea or paroxysmal nocturnal dyspnea.  GASTROINTESTINAL: No nausea, vomiting right now, but the patient had vomiting 7 days ago. She has an umbilical hernia. She has pain as described above. She denies any constipation or diarrhea. No blood in the stool. No melena.  GENITOURINARY: No dysuria, hematuria, changes in frequency.  ENDOCRINE: No polyuria, polydipsia, polyphagia or cold or heat intolerance.  HEMATOLOGIC: Positive anemia. Positive easy bruising. Positive pancytopenia. No swollen glands.  SKIN: No rashes, petechiae. Occasional bruises positive.  MUSCULOSKELETAL: No significant joint pain, redness, swelling.  NEUROLOGIC: No numbness, tingling or migraines.  PSYCHIATRIC: No anxiety or depression.   PAST MEDICAL HISTORY: 1.  Liver cirrhosis due to alcohol abuse, in the past was on hospice but now she has been dismissed from it.  2.  Ascites with multiple paracenteses.  3.  Pancytopenia.  4.  History of previous hepatic encephalopathy.   PAST SURGICAL HISTORY: BTL and paracentesis. She had extraction of a hematoma of the right lower extremity under local anesthesia.   SOCIAL HISTORY: The patient lives with her 2 daughters. She is no longer on hospice. She used to drink very heavily. She denies any other IV drug abuse. She does not smoke.   FAMILY HISTORY: No cancer. Positive for coronary artery  disease in her father, who also had a stroke. Positive diabetes in her older sister.   ALLERGIES: PENICILLIN GAVE HER A RASH AROUND MOUTH.  MEDICATIONS: Furosemide 40 mg twice daily. The patient says that she should be taking nadolol 1 pill p.o. daily and also lactulose, but she is not taking those.   PHYSICAL EXAMINATION:   GENERAL: The  patient is alert, oriented x 3, no acute distress, no respiratory distress.  VITAL SIGNS: Blood pressure is 104/60, pulse 75, respirations 18, temperature 98.2.  HEENT: Positive icterus of the sclerae. Mucosae are red. Mucosae are well-moisture. No oral lesions. No oropharyngeal exudates.  NECK: Supple. No JVD. No thyromegaly. No adenopathy. No carotid bruits. No rigidity.  CARDIOVASCULAR: Regular rate and rhythm. No murmurs, rubs or gallops are appreciated at this moment. No tenderness to palpation of anterior chest wall. No displacement of PMI.  LUNGS: Clear, without any wheezing or crepitus. There are decreased respiratory sounds in both bases. No dullness to percussion.  ABDOMEN: Distended with ascites, ascitic wave positive. There is an umbilical hernia which is not incarcerated, no changes in color, no significant tenderness to palpation. Positive hepatic stigmata with telangiectasias.  GENITAL: Deferred.  EXTREMITIES: No significant edema, cyanosis or clubbing.  VASCULAR: Capillary refill about 3 seconds, pulses +2 bilaterally,  SKIN: No rashes or petechiae. Positive occasional hematomas and ecchymosis of the lower extremities. Jaundiced tone. LYMPHATIC: Negative for lymphadenopathy in neck or supraclavicular areas.  MUSCULOSKELETAL: No significant edema. No significant erythema of the joints.  SIGNIFICANT LABORATORY AND DIAGNOSTIC DATA: INR is 2.0. PT is 22. White count is 2.2; hemoglobin is 10; platelet count is 39,000. MELD score is around 18. Potassium is 3.3, creatinine 0.8, sodium 137; calcium is 7.5 with an albumin level of 1.9; bilirubin is 2.9. EKG: Normal sinus rhythm. No ST depression or elevation. Urinalysis: No white blood cells, no red blood cells, no signs of infection. Previous bilirubin about 6 months ago was 3.3 and her INR was 2.1.   ASSESSMENT AND PLAN: A 49 year old female with history of cirrhosis of the liver secondary to previous alcohol abuse. Comes to the hospital  with abdominal pain, distention, umbilical hernia. The patient has been chronically ill for a long time.  1.  Abdominal pain and umbilical hernia: At this moment, the patient is really not a good candidate for surgical management of the hernia, unless this hernia is incarcerated or the patient has any obstruction. At this moment, it does not seem like the hernia is incarcerated. It is reducible to some degree. We are going to go ahead and do a paracenteses and after paracentesis evaluate the reversibility of the hernia. I do not think there is a need to get a surgical consult at this moment, as the patient does not have abdominal emergency. Her abdomen is benign. Her hernia is not incarcerated, necrosed or thrombosed. If there is significant increase in pain, increase in white blood cells or abnormal obstruction, we are going to get a consult with general surgery. As the patient has abdominal pain likely due to distention with the ascites, we are going to try drain it.  2.  Ascites: The patient will undergo a paracentesis. At this moment, we cannot do it right now because the patient had an INR of 2.0 and a platelet count of 39,000. We are going to try to correct these coagulation problems with vitamin K, giveFFP if necessary, and we are going to give platelets to do the procedure. The patient is hemodynamically stable.  She has large ascites, which is compressing her diaphragm, keeping her short of breath all the time and creating some abdominal pain due to distention. There are no signs of spontaneous bacterial peritonitis.  3.  Gastrointestinal prophylaxis with ranitidine. Avoid proton pump inhibitor to prevent Clostridium difficile infections.  4.  Portal hypertension: Continue nadolol.  5.  History of hepatic encephalopathy: At this moment, the patient has been of lactulose for a long time and she is alert and oriented x 3. We are going to restart her on home dose of lactulose.  6.  Hypokalemia: Replace  with oral potassium.  7.  Other medical problems are stable.  8.  The patient is a full code.  9.  Deep vein thrombosis prophylaxis with mechanical compression devices, as the patient has coagulopathy.   TIME SPENT: I spent about 45 minutes with this patient.    ____________________________ Felipa Furnace, MD rsg:jm D: 12/21/2012 14:01:51 ET T: 12/21/2012 14:31:19 ET JOB#: [id number]  cc: Felipa Furnace, MD, <Dictator> Ellowyn Rieves Juanda Chance MD ELECTRONICALLY SIGNED 12/25/2012 11:57
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
## 2015-11-09 ENCOUNTER — Inpatient Hospital Stay (HOSPITAL_COMMUNITY): Payer: Self-pay

## 2015-11-09 ENCOUNTER — Emergency Department: Payer: Self-pay

## 2015-11-09 ENCOUNTER — Inpatient Hospital Stay
Admission: EM | Admit: 2015-11-09 | Discharge: 2015-11-24 | DRG: 871 | Disposition: E | Payer: Self-pay | Attending: Pulmonary Disease | Admitting: Pulmonary Disease

## 2015-11-09 DIAGNOSIS — J9600 Acute respiratory failure, unspecified whether with hypoxia or hypercapnia: Secondary | ICD-10-CM

## 2015-11-09 DIAGNOSIS — R6521 Severe sepsis with septic shock: Secondary | ICD-10-CM | POA: Diagnosis present

## 2015-11-09 DIAGNOSIS — R569 Unspecified convulsions: Secondary | ICD-10-CM

## 2015-11-09 DIAGNOSIS — A419 Sepsis, unspecified organism: Secondary | ICD-10-CM

## 2015-11-09 DIAGNOSIS — E872 Acidosis, unspecified: Secondary | ICD-10-CM

## 2015-11-09 DIAGNOSIS — T8059XA Anaphylactic reaction due to other serum, initial encounter: Secondary | ICD-10-CM | POA: Insufficient documentation

## 2015-11-09 DIAGNOSIS — J811 Chronic pulmonary edema: Secondary | ICD-10-CM | POA: Diagnosis present

## 2015-11-09 DIAGNOSIS — E876 Hypokalemia: Secondary | ICD-10-CM | POA: Diagnosis present

## 2015-11-09 DIAGNOSIS — D689 Coagulation defect, unspecified: Secondary | ICD-10-CM

## 2015-11-09 DIAGNOSIS — D6959 Other secondary thrombocytopenia: Secondary | ICD-10-CM | POA: Diagnosis present

## 2015-11-09 DIAGNOSIS — E871 Hypo-osmolality and hyponatremia: Secondary | ICD-10-CM | POA: Diagnosis present

## 2015-11-09 DIAGNOSIS — D61818 Other pancytopenia: Secondary | ICD-10-CM

## 2015-11-09 DIAGNOSIS — D696 Thrombocytopenia, unspecified: Secondary | ICD-10-CM

## 2015-11-09 DIAGNOSIS — K7460 Unspecified cirrhosis of liver: Secondary | ICD-10-CM | POA: Diagnosis present

## 2015-11-09 DIAGNOSIS — I469 Cardiac arrest, cause unspecified: Secondary | ICD-10-CM

## 2015-11-09 DIAGNOSIS — Z66 Do not resuscitate: Secondary | ICD-10-CM | POA: Diagnosis present

## 2015-11-09 DIAGNOSIS — J189 Pneumonia, unspecified organism: Secondary | ICD-10-CM

## 2015-11-09 DIAGNOSIS — E162 Hypoglycemia, unspecified: Secondary | ICD-10-CM | POA: Diagnosis present

## 2015-11-09 DIAGNOSIS — J9621 Acute and chronic respiratory failure with hypoxia: Secondary | ICD-10-CM

## 2015-11-09 DIAGNOSIS — G9340 Encephalopathy, unspecified: Secondary | ICD-10-CM | POA: Diagnosis present

## 2015-11-09 DIAGNOSIS — A401 Sepsis due to streptococcus, group B: Principal | ICD-10-CM | POA: Diagnosis present

## 2015-11-09 DIAGNOSIS — Z880 Allergy status to penicillin: Secondary | ICD-10-CM

## 2015-11-09 LAB — BLOOD GAS, ARTERIAL
ACID-BASE DEFICIT: 18.2 mmol/L — AB (ref 0.0–2.0)
ACID-BASE DEFICIT: 22.3 mmol/L — AB (ref 0.0–2.0)
Allens test (pass/fail): POSITIVE — AB
BICARBONATE: 7.1 meq/L — AB (ref 21.0–28.0)
Bicarbonate: 10.3 mEq/L — ABNORMAL LOW (ref 21.0–28.0)
FIO2: 40
FIO2: 1
Mechanical Rate: 16
O2 SAT: 94.6 %
O2 Saturation: 99 %
PEEP/CPAP: 5 cmH2O
PEEP: 5 cmH2O
PH ART: 7.09 — AB (ref 7.350–7.450)
PO2 ART: 186 mmHg — AB (ref 83.0–108.0)
Patient temperature: 37
Patient temperature: 37
RATE: 24 resp/min
VT: 450 mL
VT: 450 mL
pCO2 arterial: 34 mmHg (ref 32.0–48.0)
pCO2 arterial: 28 mmHg — ABNORMAL LOW (ref 32.0–48.0)
pH, Arterial: 7.01 — CL (ref 7.350–7.450)
pO2, Arterial: 100 mmHg (ref 83.0–108.0)

## 2015-11-09 LAB — CBC WITH DIFFERENTIAL/PLATELET
BASOS PCT: 0 %
Basophils Absolute: 0 10*3/uL (ref 0–0.1)
EOS ABS: 0 10*3/uL (ref 0–0.7)
EOS PCT: 3 %
HEMATOCRIT: 30 % — AB (ref 35.0–47.0)
Hemoglobin: 9.3 g/dL — ABNORMAL LOW (ref 12.0–16.0)
LYMPHS PCT: 2 %
Lymphs Abs: 0 10*3/uL — ABNORMAL LOW (ref 1.0–3.6)
MCH: 26.9 pg (ref 26.0–34.0)
MCHC: 31.1 g/dL — ABNORMAL LOW (ref 32.0–36.0)
MCV: 86.3 fL (ref 80.0–100.0)
Monocytes Absolute: 0 10*3/uL — ABNORMAL LOW (ref 0.2–0.9)
Monocytes Relative: 2 %
NEUTROS PCT: 93 %
Neutro Abs: 1.6 10*3/uL (ref 1.4–6.5)
Platelets: 23 10*3/uL — CL (ref 150–440)
RBC: 3.48 MIL/uL — AB (ref 3.80–5.20)
RDW: 21.3 % — AB (ref 11.5–14.5)
Smear Review: DECREASED
WBC: 1.6 10*3/uL — AB (ref 3.6–11.0)

## 2015-11-09 LAB — TYPE AND SCREEN
ABO/RH(D): A POS
ANTIBODY SCREEN: NEGATIVE

## 2015-11-09 LAB — COMPREHENSIVE METABOLIC PANEL
ALBUMIN: 1.7 g/dL — AB (ref 3.5–5.0)
ALT: 26 U/L (ref 14–54)
AST: 104 U/L — AB (ref 15–41)
Alkaline Phosphatase: 59 U/L (ref 38–126)
Anion gap: 21 — ABNORMAL HIGH (ref 5–15)
BUN: 32 mg/dL — AB (ref 6–20)
CHLORIDE: 97 mmol/L — AB (ref 101–111)
CO2: 11 mmol/L — ABNORMAL LOW (ref 22–32)
Calcium: 7.1 mg/dL — ABNORMAL LOW (ref 8.9–10.3)
Creatinine, Ser: 0.95 mg/dL (ref 0.44–1.00)
GFR calc Af Amer: >60 mL/min (ref >60)
GFR calc non Af Amer: >60 mL/min (ref >60)
GLUCOSE: 109 mg/dL — AB (ref 65–99)
POTASSIUM: 2.7 mmol/L — AB (ref 3.5–5.1)
SODIUM: 129 mmol/L — AB (ref 135–145)
Total Bilirubin: 9.7 mg/dL — ABNORMAL HIGH (ref 0.3–1.2)
Total Protein: 4.6 g/dL — ABNORMAL LOW (ref 6.5–8.1)

## 2015-11-09 LAB — TROPONIN I
Troponin I: 0.13 ng/mL (ref <0.03)
Troponin I: 0.5 ng/mL (ref <0.03)

## 2015-11-09 LAB — BLOOD CULTURE ID PANEL (REFLEXED)
ACINETOBACTER BAUMANNII: NOT DETECTED
CARBAPENEM RESISTANCE: NOT DETECTED
Candida albicans: NOT DETECTED
Candida glabrata: NOT DETECTED
Candida krusei: NOT DETECTED
Candida parapsilosis: NOT DETECTED
Candida tropicalis: NOT DETECTED
ENTEROBACTERIACEAE SPECIES: NOT DETECTED
Enterobacter cloacae complex: NOT DETECTED
Enterococcus species: NOT DETECTED
Escherichia coli: NOT DETECTED
HAEMOPHILUS INFLUENZAE: NOT DETECTED
Klebsiella oxytoca: NOT DETECTED
Klebsiella pneumoniae: NOT DETECTED
LISTERIA MONOCYTOGENES: NOT DETECTED
METHICILLIN RESISTANCE: NOT DETECTED
NEISSERIA MENINGITIDIS: NOT DETECTED
Proteus species: NOT DETECTED
Pseudomonas aeruginosa: NOT DETECTED
SERRATIA MARCESCENS: NOT DETECTED
STAPHYLOCOCCUS AUREUS BCID: NOT DETECTED
STAPHYLOCOCCUS SPECIES: NOT DETECTED
STREPTOCOCCUS PYOGENES: NOT DETECTED
STREPTOCOCCUS SPECIES: DETECTED — AB
Streptococcus agalactiae: DETECTED — AB
Streptococcus pneumoniae: NOT DETECTED
Vancomycin resistance: NOT DETECTED

## 2015-11-09 LAB — URINALYSIS COMPLETE WITH MICROSCOPIC (ARMC ONLY)
GLUCOSE, UA: NEGATIVE mg/dL
Ketones, ur: NEGATIVE mg/dL
Leukocytes, UA: NEGATIVE
Nitrite: NEGATIVE
Protein, ur: 100 mg/dL — AB
Specific Gravity, Urine: 1.011 (ref 1.005–1.030)
pH: 5 (ref 5.0–8.0)

## 2015-11-09 LAB — LIPASE, BLOOD: Lipase: 14 U/L (ref 11–51)

## 2015-11-09 LAB — EXPECTORATED SPUTUM ASSESSMENT W GRAM STAIN, RFLX TO RESP C

## 2015-11-09 LAB — MRSA PCR SCREENING: MRSA BY PCR: NEGATIVE

## 2015-11-09 LAB — FIBRINOGEN: Fibrinogen: 203 mg/dL — ABNORMAL LOW (ref 210–470)

## 2015-11-09 LAB — PROTIME-INR
INR: 3
PROTHROMBIN TIME: 30.6 s — AB (ref 11.4–15.0)

## 2015-11-09 LAB — FIBRIN DERIVATIVES D-DIMER (ARMC ONLY): FIBRIN DERIVATIVES D-DIMER (ARMC): 7297 — AB (ref 0–499)

## 2015-11-09 LAB — BASIC METABOLIC PANEL
Anion gap: 17 — ABNORMAL HIGH (ref 5–15)
BUN: 29 mg/dL — AB (ref 6–20)
CALCIUM: 6 mg/dL — AB (ref 8.9–10.3)
CHLORIDE: 99 mmol/L — AB (ref 101–111)
CO2: 10 mmol/L — AB (ref 22–32)
CREATININE: 1.19 mg/dL — AB (ref 0.44–1.00)
GFR calc non Af Amer: 52 mL/min — ABNORMAL LOW (ref >60)
GFR, EST AFRICAN AMERICAN: 60 mL/min — AB (ref >60)
Glucose, Bld: 224 mg/dL — ABNORMAL HIGH (ref 65–99)
Potassium: 3.5 mmol/L (ref 3.5–5.1)
SODIUM: 126 mmol/L — AB (ref 135–145)

## 2015-11-09 LAB — LACTIC ACID, PLASMA
LACTIC ACID, VENOUS: 13.4 mmol/L — AB (ref 0.5–1.9)
LACTIC ACID, VENOUS: 10.8 mmol/L — AB (ref 0.5–1.9)

## 2015-11-09 LAB — GLUCOSE, CAPILLARY
GLUCOSE-CAPILLARY: 133 mg/dL — AB (ref 65–99)
GLUCOSE-CAPILLARY: 102 mg/dL — AB (ref 65–99)
Glucose-Capillary: 81 mg/dL (ref 65–99)

## 2015-11-09 LAB — AMMONIA: AMMONIA: 86 umol/L — AB (ref 9–35)

## 2015-11-09 LAB — PROCALCITONIN: PROCALCITONIN: 7.79 ng/mL

## 2015-11-09 LAB — BRAIN NATRIURETIC PEPTIDE: B NATRIURETIC PEPTIDE 5: 2217 pg/mL — AB (ref 0.0–100.0)

## 2015-11-09 LAB — EXPECTORATED SPUTUM ASSESSMENT W REFEX TO RESP CULTURE

## 2015-11-09 LAB — MAGNESIUM: MAGNESIUM: 1.4 mg/dL — AB (ref 1.7–2.4)

## 2015-11-09 LAB — APTT: APTT: 44 s — AB (ref 24–36)

## 2015-11-09 MED ORDER — NOREPINEPHRINE BITARTRATE 1 MG/ML IV SOLN
0.0000 ug/min | INTRAVENOUS | Status: DC
  Filled 2015-11-09: qty 16

## 2015-11-09 MED ORDER — SODIUM CHLORIDE 0.9 % IV BOLUS (SEPSIS)
1000.0000 mL | Freq: Once | INTRAVENOUS | Status: AC
  Administered 2015-11-09: 1000 mL via INTRAVENOUS

## 2015-11-09 MED ORDER — PROPOFOL 10 MG/ML IV BOLUS
INTRAVENOUS | Status: DC | PRN
  Administered 2015-11-09: 120 mg via INTRAVENOUS

## 2015-11-09 MED ORDER — DEXTROSE 5 % IV SOLN
10.0000 mg | Freq: Once | INTRAVENOUS | Status: DC
Start: 2015-11-11 — End: 2015-11-10

## 2015-11-09 MED ORDER — POTASSIUM CHLORIDE 10 MEQ/100ML IV SOLN
10.0000 meq | INTRAVENOUS | Status: AC
  Administered 2015-11-09 (×4): 10 meq via INTRAVENOUS
  Filled 2015-11-09 (×4): qty 100

## 2015-11-09 MED ORDER — FAMOTIDINE IN NACL 20-0.9 MG/50ML-% IV SOLN
20.0000 mg | Freq: Two times a day (BID) | INTRAVENOUS | Status: DC
  Administered 2015-11-09: 20 mg via INTRAVENOUS
  Filled 2015-11-09 (×2): qty 50

## 2015-11-09 MED ORDER — SODIUM CHLORIDE 0.9 % IV SOLN
1.0000 g | Freq: Once | INTRAVENOUS | Status: DC
  Filled 2015-11-09: qty 10

## 2015-11-09 MED ORDER — SODIUM CHLORIDE 0.9 % IV BOLUS (SEPSIS)
250.0000 mL | Freq: Once | INTRAVENOUS | Status: AC
  Administered 2015-11-09: 250 mL via INTRAVENOUS

## 2015-11-09 MED ORDER — KCL IN DEXTROSE-NACL 40-5-0.9 MEQ/L-%-% IV SOLN
INTRAVENOUS | Status: DC
Start: 2015-11-09 — End: 2015-11-09
  Filled 2015-11-09 (×2): qty 1000

## 2015-11-09 MED ORDER — MAGNESIUM SULFATE 2 GM/50ML IV SOLN
2.0000 g | Freq: Once | INTRAVENOUS | Status: AC
  Administered 2015-11-09: 2 g via INTRAVENOUS
  Filled 2015-11-09: qty 50

## 2015-11-09 MED ORDER — IPRATROPIUM-ALBUTEROL 0.5-2.5 (3) MG/3ML IN SOLN
3.0000 mL | RESPIRATORY_TRACT | Status: DC
  Administered 2015-11-09: 3 mL via RESPIRATORY_TRACT
  Filled 2015-11-09: qty 3

## 2015-11-09 MED ORDER — ROCURONIUM BROMIDE 50 MG/5ML IV SOLN
INTRAVENOUS | Status: DC | PRN
  Administered 2015-11-09: 60 mg via INTRAVENOUS

## 2015-11-09 MED ORDER — DEXTROSE 5 % IV SOLN
1.0000 g | Freq: Once | INTRAVENOUS | Status: DC
  Filled 2015-11-09: qty 1

## 2015-11-09 MED ORDER — LEVOFLOXACIN IN D5W 750 MG/150ML IV SOLN
750.0000 mg | Freq: Once | INTRAVENOUS | Status: AC
  Administered 2015-11-09: 750 mg via INTRAVENOUS
  Filled 2015-11-09: qty 150

## 2015-11-09 MED ORDER — SODIUM CHLORIDE 0.9 % IV SOLN
0.0300 [IU]/min | INTRAVENOUS | Status: DC
  Administered 2015-11-09: 0.03 [IU]/min via INTRAVENOUS
  Filled 2015-11-09: qty 2

## 2015-11-09 MED ORDER — VANCOMYCIN HCL IN DEXTROSE 1-5 GM/200ML-% IV SOLN
1000.0000 mg | Freq: Once | INTRAVENOUS | Status: AC
  Administered 2015-11-09: 1000 mg via INTRAVENOUS
  Filled 2015-11-09: qty 200

## 2015-11-09 MED ORDER — PROPOFOL 1000 MG/100ML IV EMUL
INTRAVENOUS | Status: DC | PRN
  Administered 2015-11-09: 10 ug/kg/min via INTRAVENOUS

## 2015-11-09 MED ORDER — DEXTROSE 5 % IV SOLN
10.0000 mg | Freq: Once | INTRAVENOUS | Status: DC
  Filled 2015-11-09: qty 1

## 2015-11-09 MED ORDER — FOLIC ACID 1 MG PO TABS
1.0000 mg | ORAL_TABLET | Freq: Every day | ORAL | Status: DC
  Administered 2015-11-09: 1 mg via ORAL
  Filled 2015-11-09: qty 1

## 2015-11-09 MED ORDER — PHENYLEPHRINE HCL 10 MG/ML IJ SOLN
0.0000 ug/min | INTRAVENOUS | Status: DC
  Filled 2015-11-09: qty 4

## 2015-11-09 MED ORDER — ADULT MULTIVITAMIN LIQUID CH
15.0000 mL | Freq: Every day | ORAL | Status: DC
  Administered 2015-11-09: 15 mL via ORAL
  Filled 2015-11-09 (×2): qty 15

## 2015-11-09 MED ORDER — LACTULOSE 10 GM/15ML PO SOLN
30.0000 g | Freq: Two times a day (BID) | ORAL | Status: DC
  Administered 2015-11-09: 30 g via ORAL
  Filled 2015-11-09: qty 60

## 2015-11-09 MED ORDER — SODIUM CHLORIDE 0.9 % IV SOLN: 250.0000 mL | INTRAVENOUS | Status: DC | PRN

## 2015-11-09 MED ORDER — PHENYLEPHRINE HCL 10 MG/ML IJ SOLN
0.0000 ug/min | INTRAVENOUS | Status: DC
  Administered 2015-11-09: 60 ug/min via INTRAVENOUS
  Administered 2015-11-09: 80 ug/min via INTRAVENOUS
  Administered 2015-11-09: 100 ug/min via INTRAVENOUS
  Administered 2015-11-09: 90 ug/min via INTRAVENOUS
  Administered 2015-11-09: 48 ug/min via INTRAVENOUS
  Filled 2015-11-09 (×4): qty 1

## 2015-11-09 MED ORDER — ANTISEPTIC ORAL RINSE SOLUTION (CORINZ)
7.0000 mL | OROMUCOSAL | Status: DC
Start: 2015-11-09 — End: 2015-11-10
  Administered 2015-11-09: 7 mL via OROMUCOSAL
  Filled 2015-11-09 (×9): qty 7

## 2015-11-09 MED ORDER — THIAMINE HCL 100 MG/ML IJ SOLN: 100.0000 mg | Freq: Every day | INTRAMUSCULAR | Status: DC

## 2015-11-09 MED ORDER — HYDROCORTISONE NA SUCCINATE PF 100 MG IJ SOLR
50.0000 mg | Freq: Four times a day (QID) | INTRAMUSCULAR | Status: DC
  Administered 2015-11-09: 50 mg via INTRAVENOUS
  Filled 2015-11-09: qty 2

## 2015-11-09 MED ORDER — VITAMIN K1 10 MG/ML IJ SOLN
10.0000 mg | INTRAVENOUS | Status: AC
  Administered 2015-11-09: 10 mg via INTRAVENOUS
  Filled 2015-11-09: qty 1

## 2015-11-09 MED ORDER — NOREPINEPHRINE 4 MG/250ML-% IV SOLN
INTRAVENOUS | Status: AC
  Filled 2015-11-09: qty 250

## 2015-11-09 MED ORDER — CHLORHEXIDINE GLUCONATE 0.12% ORAL RINSE (MEDLINE KIT)
15.0000 mL | Freq: Two times a day (BID) | OROMUCOSAL | Status: DC
  Filled 2015-11-09 (×2): qty 15

## 2015-11-09 MED ORDER — RIFAXIMIN 550 MG PO TABS
550.0000 mg | ORAL_TABLET | Freq: Three times a day (TID) | ORAL | Status: DC
  Administered 2015-11-09: 550 mg via ORAL
  Filled 2015-11-09: qty 1

## 2015-11-09 MED ORDER — NOREPINEPHRINE BITARTRATE 1 MG/ML IV SOLN: 0.0000 ug/min | INTRAVENOUS | Status: DC

## 2015-11-09 MED ORDER — NOREPINEPHRINE 4 MG/250ML-% IV SOLN
0.0000 ug/min | INTRAVENOUS | Status: DC
  Administered 2015-11-09 (×2): 20 ug/min via INTRAVENOUS
  Administered 2015-11-09: 12 ug/min via INTRAVENOUS
  Filled 2015-11-09: qty 250

## 2015-11-09 MED ORDER — VANCOMYCIN HCL IN DEXTROSE 750-5 MG/150ML-% IV SOLN
750.0000 mg | Freq: Two times a day (BID) | INTRAVENOUS | Status: DC
  Administered 2015-11-09: 750 mg via INTRAVENOUS
  Filled 2015-11-09 (×2): qty 150

## 2015-11-09 MED ORDER — DEXTROSE 5 % IV SOLN
2.0000 g | Freq: Once | INTRAVENOUS | Status: DC
  Administered 2015-11-09: 2 g via INTRAVENOUS
  Filled 2015-11-09: qty 2

## 2015-11-09 MED ORDER — SODIUM BICARBONATE 8.4 % IV SOLN
INTRAVENOUS | Status: DC
  Administered 2015-11-09: 15:00:00 via INTRAVENOUS
  Filled 2015-11-09 (×3): qty 150

## 2015-11-09 MED ORDER — SODIUM CHLORIDE 0.9 % IV SOLN
Freq: Once | INTRAVENOUS | Status: AC
Start: 2015-11-09 — End: 2015-11-09
  Administered 2015-11-09: 15:00:00 via INTRAVENOUS

## 2015-11-09 MED ORDER — PROPOFOL 1000 MG/100ML IV EMUL
INTRAVENOUS | Status: AC
  Filled 2015-11-09: qty 100

## 2015-11-09 MED ORDER — CEFEPIME HCL 2 G IJ SOLR
2.0000 g | Freq: Two times a day (BID) | INTRAMUSCULAR | Status: DC
Start: 2015-11-09 — End: 2015-11-10
  Administered 2015-11-09: 2 g via INTRAVENOUS
  Filled 2015-11-09 (×3): qty 2

## 2015-11-09 MED ORDER — DEXTROSE-NACL 5-0.9 % IV SOLN: INTRAVENOUS | Status: DC

## 2015-11-09 NOTE — Progress Notes (Signed)
Pharmacy Antibiotic Note  Colleen RetortMaria Greene is a 50 y.o. female admitted on 12-31-2015 with sepsis.  Pharmacy has been consulted for vancomycin and cefepime dosing.  Plan: Patient received vancomycin 1000 mg IV  X 1 in ED. Will initiate patient on vancomycin 750mg  IV Q12hr for goal trough of 15-20.   Will continue patient on cefepime 2g IV Q12hr.     Weight: 151 lb 0.2 oz (68.5 kg)  Temp (24hrs), Avg:94.1 F (34.5 C), Min:89.8 F (32.1 C), Max:95 F (35 C)   Recent Labs Lab 2015/04/29 0849 2015/04/29 1142  WBC 1.6*  --   CREATININE 0.95  --   LATICACIDVEN 13.4* 10.8*    CrCl cannot be calculated (Unknown ideal weight.).    Allergies  Allergen Reactions  . Penicillins     Has patient had a PCN reaction causing immediate rash, facial/tongue/throat swelling, SOB or lightheadedness with hypotension: No Has patient had a PCN reaction causing severe rash involving mucus membranes or skin necrosis: No Has patient had a PCN reaction that required hospitalization No Has patient had a PCN reaction occurring within the last 10 years: No If all of the above answers are "NO", then may proceed with Cephalosporin use.   . Pork-Derived Products Itching and Swelling    Antimicrobials this admission: Cefepime 7/17 >>  Vancomycin 7/17 >>  Aztreonam 7/17 >> 7/17  Levofloxacin 7/17 >> 7/17  Dose adjustments this admission: N/A  Microbiology results: 7/17 BCx: pending   7/17 UCx: pending   7/17 Sputum: pending  7/17 MRSA PCR: pending   Pharmacy will continue to monitor and adjust per consult.    Philippe Gang L 12-31-2015 4:22 PM

## 2015-11-09 NOTE — Progress Notes (Signed)
Patient arrived on unit shortly after 1215. Patient was intubated in ED. After arrival patient was noted to be hypotensive with vital sign check. Dr. Sung AmabileSimonds and Sonda Rumbleana Blakeney, NP were notified of vital signs. With pulse check patient was found to have no pulse. Code lasted approximately 10 minutes (refer to code records). Family updated on condition by NP. Trudee KusterBrandi R Mansfield

## 2015-11-09 NOTE — H&P (Signed)
PULMONARY / CRITICAL CARE MEDICINE   Name: Colleen Greene MRN: [id number] DOB: 04/04/1966    ADMISSION DATE:  11/11/2015 CONSULTATION DATE:  11/19/2015  PT PROFILE: 48 F recalcitrant alcoholic with cirrhosis who suffered witnessed out of hospital seizure (no prior history of seizures but prior history of SDH and found to be severely hypoglycemic). She was intubated in ED. After admission to ICU, developed PEA arrest. Other problems include shock requiring vasopressors, severe thrombocytopenia, severe coagulopathy, hyperlactatemia, ascites noted on CTAP.   MAJOR EVENTS/TEST RESULTS: 07/17 CT head: Atrophy for age. Mild periventricular small vessel disease. No intracranial mass, hemorrhage, or extra-axial fluid collection. No acute infarct evident. Areas of vascular calcification in the cavernous carotid arteries 07/17 CTAP: Evidence of hepatic cirrhosis, stable from 2013 study. Splenomegaly. Varices are noted at multiple sites, most notably in the periumbilical region. There are felt to be esophageal and perisplenic varices as well. Extensive ascites. Stomach is distended with fluid and to a lesser extent air despite the presence of a nasogastric tube with the tip in the stomach. There is patchy bibasilar airspace consolidation with a left pleural effusion. There is a degree of anasarca. There is cholelithiasis. Air within the urinary bladder is likely due to recent Foley catheter placement. There is no obvious traumatic appearing lesion, although it must be cautioned that the other changes which are present could obscure traumatic lesions. 07/17 shortly after arrival to ICU, progressive hypotension > PEA arrest. S/P 10-15 mins chest compressions, 3 amps epi. Vasopressors (NE + VP) and stress dose steroids initiated  INDWELLING DEVICES:: ETT 07/17 >>  L femoral CVL 07/17 >>  L femoral A-line 07/17 >>   MICRO: Urine 07/17 >>  Resp 07/17 >>   Blood 07/17 >>    ANTIBIOTICS: Vancomycin 7/17 >>   Cefepime 7/17 >>   HISTORY OF PRESENT ILLNESS:   This is a 50 yo female with a PMH of cirrhosis, ascites,  thrombocytopenia, subdural hematoma, anemia, MDRO (2013), and umbilical hernia.  She presents to [facility] ER on 7/17 via EMS from home following questionable seizure activity and altered mental status.  Upon arrival EMS found her blood glucose was 36, and she was given D50 following administration her blood sugar increased to over 200.  She remained altered and she was given a total of 4 mg Narcan which seemed to improve mental status, but she is still not interactive.  She required intubation in the ER for airway protection.  According to pts daughter the patient has had poor po intake for several days and drinks an estimated 12 pack of beer daily. She does not have a hx of seizures. PCCM to admit pt to ICU.  Upon arrival to ICU on 7/17 pt went into a PEA arrest for a total of 10 minutes received a total of 3 epinephrine per ACLS protocol with ROSC.  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  PAST SURGICAL HISTORY: She  has past surgical history that includes Abdominal hysterectomy.  Allergies  Allergen Reactions  . Penicillins     Has patient had a PCN reaction causing immediate rash, facial/tongue/throat swelling, SOB or lightheadedness with hypotension: No Has patient had a PCN reaction causing severe rash involving mucus membranes or skin necrosis: No Has patient had a PCN reaction that required hospitalization No Has patient had a PCN reaction occurring within the last 10 years: No  If all of the above answers are "NO", then may proceed with Cephalosporin use.   . Pork-Derived Products Itching and Swelling    No current facility-administered medications on file prior to encounter.   Current Outpatient  Prescriptions on File Prior to Encounter  Medication Sig  . folic acid (FOLVITE) 1 MG tablet Take 1 tablet (1 mg total) by mouth daily.  . furosemide (LASIX) 20 MG tablet Take 0.5 tablets (10 mg total) by mouth daily.  . hydrOXYzine (ATARAX/VISTARIL) 25 MG tablet Take 1 tablet (25 mg total) by mouth 3 (three) times daily as needed for itching, anxiety or nausea.  . magnesium oxide (MAG-OX) 400 (241.3 MG) MG tablet Take 1 tablet (400 mg total) by mouth 2 (two) times daily.  . Multiple Vitamin (MULTIVITAMIN WITH MINERALS) TABS tablet Take 1 tablet by mouth daily.  . rifaximin (XIFAXAN) 550 MG TABS tablet Take 1 tablet (550 mg total) by mouth 2 (two) times daily.  Marland Kitchen. spironolactone (ALDACTONE) 25 MG tablet Take 1 tablet (25 mg total) by mouth daily.  Marland Kitchen. thiamine 100 MG tablet Take 1 tablet (100 mg total) by mouth daily.  . feeding supplement, ENSURE COMPLETE, (ENSURE COMPLETE) LIQD Take 237 mLs by mouth 2 (two) times daily between meals.  . feeding supplement, RESOURCE BREEZE, (RESOURCE BREEZE) LIQD Take 1 Container by mouth 2 (two) times daily with breakfast and lunch.    FAMILY HISTORY:  Her has no family status information on file.   SOCIAL HISTORY: She  reports that she has never smoked. She does not have any smokeless tobacco history on file. She reports that she drinks about 1.5 oz of alcohol per week.  REVIEW OF SYSTEMS:   Unable to assess pt intubated   SUBJECTIVE:  Pt mechanically intubated  VITAL SIGNS: BP 134/76 mmHg  Pulse 103  Temp(Src) 95 F (35 C)  Resp 16  Wt 151 lb 0.2 oz (68.5 kg)  SpO2 98%  HEMODYNAMICS:    VENTILATOR SETTINGS: Vent Mode:  [-] AC FiO2 (%):  [40 %] 40 % Set Rate:  [16 bmp] 16 bmp Vt Set:  [450 mL] 450 mL PEEP:  [5 cmH20] 5 cmH20  INTAKE / OUTPUT:    PHYSICAL EXAMINATION: General: critically ill appearing female Neuro:  sedated, does not follow commands HEENT:  supple, no JVD Cardiovascular: sinus tach, s1s2, no M/R/G Lungs:   mechanically vented, course throughout, even, non labored Abdomen:  mild ascites, umbilical hernia, hypoactive BS x4, soft, non tender Musculoskeletal:  normal tone Skin:  scattered ecchymosis  LABS:  BMET  Recent Labs Lab 02/06/2016 0849  NA 129*  K 2.7*  CL 97*  CO2 11*  BUN 32*  CREATININE 0.95  GLUCOSE 109*    Electrolytes  Recent Labs Lab 02/06/2016 0849  CALCIUM 7.1*    CBC  Recent Labs Lab 02/06/2016 0849  WBC 1.6*  HGB 9.3*  HCT 30.0*  PLT 23*    Coag's  Recent Labs Lab 02/06/2016 0849  APTT 44*  INR 3.00    Sepsis Markers  Recent Labs Lab 02/06/2016 0849  LATICACIDVEN 13.4*    ABG  Recent Labs Lab 02/06/2016 0938  PHART 7.09*  PCO2ART 34  PO2ART 100    Liver Enzymes  Recent Labs Lab 02/06/2016 0849  AST 104*  ALT 26  ALKPHOS 59  BILITOT 9.7*  ALBUMIN 1.7*    Cardiac Enzymes  Recent Labs Lab 02/06/2016 0849  TROPONINI 0.13*    Glucose No results for input(s): GLUCAP in the last  168 hours.  CXR: cardiomegaly, edema pattern   ASSESSMENT / PLAN:  PULMONARY A: Acute respiratory failure due to AMS, post ictal Pulm edema pattern on CXR P:   Vent settings established Vent bundle implemented Daily SBT as indicated Empiric nebulized bronchodilators  CARDIOVASCULAR A:  Cardiac arrest (PEA arrest) Shock unclear etiology - cardiogenic vs hemorrhagic vs septic Elevated troponin  P:  Vasopressin and Levophed as needed to maintain map >65 BNP today 7/17  Echo ordered Telemetry monitoring  Trend troponin's Start stress dose steroids 7/17   RENAL A:   Hyponatremia Hypokalemia Severe metabolic Acidosis with hyperlactemia P:   Trend BMP's Monitor UOP Replace potassium and magnesium 7/17  Sodium Bicarb 150 meq in D5W at 100 ml/hr Recheck BMP today 7/17   GASTROINTESTINAL A:   Cirrhosis with ascites P:   Trend liver enzymes Pepcid  Consider TFs 07/18  HEMATOLOGIC A:   Neutropenia   Thrombocytopenia Anemia  Coagulopathy P:  Trend CBC, PT, and INR No mechanical anticoagulation SCD's for VTE prophylaxis  Monitor for s/sx of bleeding  Administer Vitamin K+ 10 mg daily for 3 days start date 7/17 Transfuse 1 unit of Platelets and 2 units of FFP  Transfuse if hgb <7 Neutropenic precautions Recheck PT and INR today 7/17   INFECTIOUS A:   ?Sepsis  P:   Continue abx as listed above Trend WBC and monitor fever curve Trend PCT's Follow cultures  ENDOCRINE A:   Hypoglycemia - likely due to liver failure and poor PO intake P:   CBG's q1hr for now Follow Hypoglycemic ICU protocol  NEUROLOGIC A:   Acute encephalopathy Seizure activity likely secondary to hypoglycemia ETOH abuse P:   EEG today 7/17 may need Neurology Consult CBG's q1hr for now RASS goal: 0 to -1 Monitor for s/sx of ETOH withdrawal  Thiamine, folate, and multivitamin Lactulose 30 g bid  Propofol to maintain RASS goal  Prn Fentanyl for pain and to maintain RASS goal Seizure precautions   FAMILY  I have updated pt's daughter in ED and after the cardiac arrest. I explained that, based on severity of acute illness and on severe liver disease with all of its manifestations, her prognosis is very poor. We are considering goals of care and reasonable limitations to our interventions. I have encouraged no further ACLS and no hemodialysis  CCM time: 60 mins The above time includes time spent in consultation with patient and/or family members and multiple rechecks after admission  Billy Fischer, MD PCCM service Mobile [phone] Pager [phone]   Pt was seen in conjunction with: Sonda Rumble, AGNP  Pulmonary/Critical Care Pager [phone] (please enter 7 digits) PCCM Consult Pager [phone] (please enter 7 digits)

## 2015-11-09 NOTE — ED Provider Notes (Signed)
[facility] Emergency Department Provider Note  ____________________________________________  Time seen: 8:30 AM on arrival by EMS  I have reviewed the triage vital signs and the nursing notes.   HISTORY  Chief Complaint Seizures and Respiratory Distress  Level 5 caveat:  Portions of the history and physical were unable to be obtained due to the patient's acute illness and altered mental status   HPI Colleen Greene is a 50 y.o. female brought to the ED via EMS from home for a seizure. Family denied to EMS that she has a history of seizures. EMS found that her blood glucose was 36 and gave D50 which increased her blood sugar to over 200. Patient has remained altered since then. They gave a total of 4 mg of Narcan which seemed to improve, but she is still not interactive. Patient unable to provide any history. She does have a history of cirrhosis with liver failure and ascites. Electronic medical records also indicates history of subdural hematoma.     Past Medical History  Diagnosis Date  . Cirrhosis   . Thrombocytopenia   . Subdural hematoma   . Anemia   . Thrombocytopenia   . 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 Hernia, umbilical      Patient Active Problem List   Diagnosis Date Noted  . Other pancytopenia (HCC) 08/01/2013  . Forearm swelling: RUE 08/01/2013  . Subdural hematoma (HCC) 07/30/2013  . Cirrhosis, alcoholic (HCC) 07/26/2013  . Encephalopathy acute 07/26/2013  . Thrombocytopenia (HCC) 07/26/2013  . Protein-calorie malnutrition, severe (HCC) 07/26/2013     Past Surgical History  Procedure Laterality Date  . Abdominal hysterectomy       Current Outpatient Rx  Name  Route  Sig  Dispense  Refill  . folic acid (FOLVITE) 1 MG tablet   Oral   Take 1 tablet (1 mg total) by mouth daily.         . furosemide (LASIX) 20 MG tablet   Oral   Take 0.5 tablets (10 mg total) by mouth daily.    30 tablet   0   . hydrOXYzine (ATARAX/VISTARIL) 25 MG tablet   Oral   Take 1 tablet (25 mg total) by mouth 3 (three) times daily as needed for itching, anxiety or nausea.   30 tablet   0   . magnesium oxide (MAG-OX) 400 (241.3 MG) MG tablet   Oral   Take 1 tablet (400 mg total) by mouth 2 (two) times daily.         . Multiple Vitamin (MULTIVITAMIN WITH MINERALS) TABS tablet   Oral   Take 1 tablet by mouth daily.         . rifaximin (XIFAXAN) 550 MG TABS tablet   Oral   Take 1 tablet (550 mg total) by mouth 2 (two) times daily.   60 tablet   0   . spironolactone (ALDACTONE) 25 MG tablet   Oral   Take 1 tablet (25 mg total) by mouth daily.   31 tablet   0   . thiamine 100 MG tablet   Oral   Take 1 tablet (100 mg total) by mouth daily.         . traMADol (ULTRAM) 50 MG tablet   Oral   Take 50 mg by mouth every 6 (six) hours as needed.         . feeding supplement, ENSURE COMPLETE, (ENSURE COMPLETE) LIQD   Oral  Take 237 mLs by mouth 2 (two) times daily between meals.         . feeding supplement, RESOURCE BREEZE, (RESOURCE BREEZE) LIQD   Oral   Take 1 Container by mouth 2 (two) times daily with breakfast and lunch.      0      Allergies Penicillins and Pork-derived products   No family history on file.  Social History Social History  Substance Use Topics  . Smoking status: Never Smoker   . Smokeless tobacco: Not on file  . Alcohol Use: 1.5 oz/week    3 drink(s) per week     Comment: 3 beers per day, denies liquor     Review of Systems Unable to obtain ____________________________________________   PHYSICAL EXAM:  VITAL SIGNS: ED Triage Vitals  Enc Vitals Group     BP 11/16/2015 0839 160/141 mmHg     Pulse --      Resp 11/04/2015 0839 32     Temp --      Temp src --      SpO2 11/08/2015 0839 100 %     Weight --      Height --      Head Cir --      Peak Flow --      Pain Score --      Pain Loc --      Pain Edu? --      Excl. in  GC? --   Respiratory rate 40 End tidal CO2 15 O2 sat 96% on nasal cannula  Vital signs reviewed, nursing assessments reviewed.   Constitutional:   Stuporous. Obvious respiratory distress. Eyes:   Positive scleral icterus. No obvious globe trauma. Equal round reactive to light at 3-4 mm bilaterally. Unable to assess extraocular movements. ENT   Head:   Normocephalic and atraumatic.   Nose:   No congestion/rhinnorhea. No septal hematoma. No epistaxis.   Mouth/Throat:   Very dry mucous membranes. Multiple decayed and chipped teeth with a small amount of dried blood along the gingiva, dental injuries appear new possibly related to recent seizure.    Neck:   No stridor. No SubQ emphysema. No meningismus. Hematological/Lymphatic/Immunilogical:   No cervical lymphadenopathy. Cardiovascular:   Irregularly irregular rhythm, tachycardia heart rate 1:30 to 140  Respiratory:   Tachypnea with increased work of breathing, retractions and belly breathing. Diffuse rhonchi with inspiration. No wheezing. Breath sounds are present and symmetric in all lung fields. Gastrointestinal:   Soft and markedly distended. No obvious tenderness although exam is limited by altered mental status. There is a large umbilical hernia which is soft and reducible. Patient has been incontinent of stool, which is brown and guaiac negative with quality controls appropriate. Genitourinary:   deferred Musculoskeletal:   Normal range of motion, no joint swelling or edema. No evidence of skin infection or large wounds.. Neurologic:   gcs = E1V2M3 = 6.  Skin:    Skin is warm, dry and intact. No rash noted.  No petechiae, purpura, or bullae.  ____________________________________________    LABS (pertinent positives/negatives) (all labs ordered are listed, but only abnormal results are displayed) Labs Reviewed  LACTIC ACID, PLASMA - Abnormal; Notable for the following:    Lactic Acid, Venous 13.4 (*)    All other  components within normal limits  COMPREHENSIVE METABOLIC PANEL - Abnormal; Notable for the following:    Sodium 129 (*)    Potassium 2.7 (*)    Chloride 97 (*)    CO2 11 (*)  Glucose, Bld 109 (*)    BUN 32 (*)    Calcium 7.1 (*)    Total Protein 4.6 (*)    Albumin 1.7 (*)    AST 104 (*)    Total Bilirubin 9.7 (*)    Anion gap 21 (*)    All other components within normal limits  TROPONIN I - Abnormal; Notable for the following:    Troponin I 0.13 (*)    All other components within normal limits  CBC WITH DIFFERENTIAL/PLATELET - Abnormal; Notable for the following:    WBC 1.6 (*)    RBC 3.48 (*)    Hemoglobin 9.3 (*)    HCT 30.0 (*)    MCHC 31.1 (*)    RDW 21.3 (*)    Platelets 23 (*)    Lymphs Abs 0.0 (*)    Monocytes Absolute 0.0 (*)    All other components within normal limits  APTT - Abnormal; Notable for the following:    aPTT 44 (*)    All other components within normal limits  PROTIME-INR - Abnormal; Notable for the following:    Prothrombin Time 30.6 (*)    All other components within normal limits  URINALYSIS COMPLETEWITH MICROSCOPIC ([facility] ONLY) - Abnormal; Notable for the following:    Color, Urine AMBER (*)    APPearance HAZY (*)    Bilirubin Urine 1+ (*)    Hgb urine dipstick 3+ (*)    Protein, ur 100 (*)    Bacteria, UA RARE (*)    Squamous Epithelial / LPF 0-5 (*)    All other components within normal limits  AMMONIA - Abnormal; Notable for the following:    Ammonia 86 (*)    All other components within normal limits  BLOOD GAS, ARTERIAL - Abnormal; Notable for the following:    pH, Arterial 7.09 (*)    Bicarbonate 10.3 (*)    Acid-base deficit 18.2 (*)    All other components within normal limits  CULTURE, BLOOD (ROUTINE X 2)  CULTURE, BLOOD (ROUTINE X 2)  CULTURE, EXPECTORATED SPUTUM-ASSESSMENT  URINE CULTURE  LIPASE, BLOOD  LACTIC ACID, PLASMA  FIBRIN DERIVATIVES D-DIMER ([facility] ONLY)  FIBRINOGEN  TYPE AND SCREEN    ____________________________________________   EKG  EKG is interpreted contemporaneously by me. Initial EKG at 8:35 AM has an automated reading of acute myocardial infarction related to inferior leads, but there is wandering baseline which severely limits interpretation. I see no obvious ST elevation or acute ischemic changes. There is a normal axis, normal intervals. Tachycardia heart rate 140, atrial fibrillation.  Repeat EKG at 8:40 AM shows normal axis, normal intervals. Atrial fibrillation rate 133. Normal QRS ST segments and T waves.  ____________________________________________    RADIOLOGY Chest x-ray shows patchy airspace opacification bilaterally consistent with bilateral pneumonia. Endotracheal tube in appropriate position. NG tube in position. CT head unremarkable CT abdomen and pelvis reveals ascites, varices. Cholelithiasis. No free air or obstruction.  ____________________________________________   PROCEDURES CRITICAL CARE Performed by: Scotty Court, Chrsitopher Wik   Total critical care time: 35 minutes  Critical care time was exclusive of separately billable procedures and treating other patients.  Critical care was necessary to treat or prevent imminent or life-threatening deterioration.  Critical care was time spent personally by me on the following activities: development of treatment plan with patient and/or surrogate as well as nursing, discussions with consultants, evaluation of patient's response to treatment, examination of patient, obtaining history from patient or surrogate, ordering and performing treatments and interventions, ordering  and review of laboratory studies, ordering and review of radiographic studies, pulse oximetry and re-evaluation of patient's condition.   INTUBATION Performed by: Scotty CourtSTAFFORD, Alfrieda Tarry  Required items: required blood products, implants, devices, and special equipment available Patient identity confirmed: provided demographic data  and hospital-assigned identification number Time out: Immediately prior to procedure a "time out" was called to verify the correct patient, procedure, equipment, support staff and site/side marked as required.  Indications: Critical illness, airway protection, impending respiratory failure   Intubation method: Glidescope Laryngoscopy   Preoxygenation: 100% nonrebreather and BVM  Sedatives: Propofol Paralytic: Rocuronium  Tube Size: 7.5 cuffed  Post-procedure assessment: chest rise and ETCO2 monitor Breath sounds: equal and absent over the epigastrium Tube secured with: ETT holder Chest x-ray interpreted by radiologist and me.  Chest x-ray findings: endotracheal tube in appropriate position  Patient tolerated the procedure well with no immediate complications.     ____________________________________________   INITIAL IMPRESSION / ASSESSMENT AND PLAN / ED COURSE  Pertinent labs & imaging results that were available during my care of the patient were reviewed by me and considered in my medical decision making (see chart for details).  Patient presents with apparent sepsis likely pneumonia possible aspiration. Also had a hypoglycemic seizure at home. Due to impending respiratory failure with very increased work of breathing and tachypnea she was intubated for respiratory protection and to the expected clinical course with her critical illness. Workup reveals bilateral pneumonia. Heart rate has improved with IV fluids. Labs reveal potassium of 2.7, lactate of 13. PH is 7.09. Troponin is slightly elevated at 0.13 which I think is due to critical illness and not related to acute myocardial infarction.  Case discussed with Dr. Darrol AngelSimons in the ED for admission to ICU at 1040.     ____________________________________________   FINAL CLINICAL IMPRESSION(S) / ED DIAGNOSES  Final diagnoses:  Bilateral pneumonia  Sepsis, due to unspecified organism (HCC)  Lactic acidosis   Encephalopathy acute  Thrombocytopenia (HCC)  Coagulopathy (HCC)  Hypokalemia Elevated troponin Pancytopenia     Portions of this note were generated with dragon dictation software. Dictation errors may occur despite best attempts at proofreading.   Sharman CheekPhillip Dmitri Pettigrew, MD 2015/12/13 1046

## 2015-11-09 NOTE — Progress Notes (Signed)
Pt. Has expired. Per a order from the NP, she was extubated.

## 2015-11-09 NOTE — Procedures (Signed)
LEFT FEMORAL ARTERIAL CATHETER PLACEMENT  Procedure was performed emergently in the setting of cardiac arrest and persistent shock. Sterile prep and drape. Pulse palpated and Arrow kit used. Seldinger technique. First attempt. Good waveform. No complications  Merton Border, MD PCCM service Mobile [phone] Pager [phone] 11/02/2015

## 2015-11-09 NOTE — Progress Notes (Signed)
Pt. Was transported on the ventilator from ED to CCU 5.

## 2015-11-09 NOTE — Discharge Summary (Addendum)
  DEATH SUMMARY  Colleen Greene was a 50 yo female with PMH of cirrhosis, ascitis, thrombocytopenia, subdural hematoma, anemia.  [facility] with altered mental status and seizure activity.  She was in severe hypoglycemia, blood glucose of 36. She received D-50 for her low sugar.  Patient remained obtunded and received 4 mg of Narcan, but this did not help her mental status.  Patient was intubated in the ED and was sent to the ICU.  Upon arrival to the ICU patient went into PEA cardiac  Arrest for a total of 10 minutes , received total of 3 epinephrine.  Patient was  In severe shock state  Related to liver disease requiring multiple pressors, severe thrombocytopenia with platelets of 23.  Patient received FFP X2 platelets X1.   Dr. Sung AmabileSimonds spoke with the family and made them aware of extremely poor prognosis of patient's condition. Family decided to make the patient DNR with no further ACLS or hemodialysis. Patient is maxed out on all her pressors but patient  Does not seem to be responding to increased amount of pressors.  Dr Dellie CatholicSommers in [facility] was aware of the patient's condition. Patient's family was made aware of the patient's condition . They were allowed to be with the patient.  Chaplain was called.  Patient went into asystole and was pronounced dead at 18:41.  RT was called to extubate the patient.   Time of Death: 18:41  Date: 11/17/2015  Cause of death:  1.Cardiac arrest secondary to shock  2. Acute respiratory failure 3.Severe Thrombocytopenia 4. Severe liver cirrhosis with ascitis 5. Severe metabolic acidosis. 6. Severe sepsis/septic shock 7. Group B strep bacteremia     Bincy Varughese,AG-ACNP Pulmonary & Critical Care   Billy Fischeravid Terri Malerba, MD PCCM service Mobile [phone] Pager [phone] 10/30/2015  ADD: Blood cultures returned positive for group B strep bacteremia.  Severe sepsis/septic shock are added to discharge diagnoses  Billy Fischeravid Stormee Duda, MD PCCM  service Mobile [phone] Pager [phone] 12/15/2015

## 2015-11-09 NOTE — Progress Notes (Signed)
Per Dr Sung AmabileSimonds maintain patients temp at 34.8 to 35 celsius. Warming blanket placed temp at 34.6

## 2015-11-09 NOTE — Progress Notes (Signed)
CDS called back to release pt. No longer candidate for eye donation.

## 2015-11-09 NOTE — ED Notes (Signed)
Dr. Scotty CourtStafford notified of platelet count 23.

## 2015-11-09 NOTE — Progress Notes (Signed)
[name], NP notified of calcium of 6.0. Colleen KusterBrandi R Greene

## 2015-11-09 NOTE — Procedures (Signed)
CPR NOTE  Upon arrival to ICU, pt developed hypotension that progressed to PEA arrest. She underwent approx 10-15 minutes of chest compressions and received 3 amps of epinephrine with restoration of pulse. She was stabilized on norepinephrine. See Code Documentation sheet for details  Billy Fischeravid Morgana Rowley, MD PCCM service Mobile [phone] Pager [phone] 07-29-15

## 2015-11-09 NOTE — ED Notes (Signed)
Pt arrived via EMS from home for reports of witnessed grand mal seizure and respiratory distress. EMS reports pinpoint pupils. EMS administered a total of 4 mg Narcan. CBG 36, EMS administered 1 amp D50, CBG increased to 231. EMS reports seizure like activity and administered Versed 2 mg. EMS reports 125/91, CO2 15, RR 40-45, 95.6 axillary. Pt incontinent of urine on arrival.

## 2015-11-09 NOTE — Progress Notes (Signed)
Abg resulted in PH.7.09, pCo2 34, P02 100, S02 100, BE -18.2, HCO3- 10.3.  Called critical value back to Dr. Scotty CourtStafford and reported.  Unable to cross information over at this time due to technical issue with [employer].  Will report this technical issue to appropriate persons.

## 2015-11-09 NOTE — Progress Notes (Signed)
Spoke with Bincy regarding patients pressures. Levop, neo and vasopressin transfusing. Spoke with Dr Sung AmabileSimonds regarding SpO2 not reading. At this point no additional orders.

## 2015-11-09 NOTE — Progress Notes (Signed)
Bincy Pa pronounced patient at 18:41. She notified Dr Sung AmabileSimonds. I called [facility] and supervisor. She is a candidate for eye donation. Passed to next RN to perform eye prep.

## 2015-11-09 NOTE — Progress Notes (Signed)
Spoke with Dr Sung AmabileSimonds patients SPO2 not reading. At this time no new orders given.

## 2015-11-09 NOTE — Progress Notes (Signed)
E-link notified of pt extubation.

## 2015-11-09 NOTE — Progress Notes (Signed)
  Dr. Sung AmabileSimonds spoke to the family and made patient DNR.  Will pursue aggresive medical care for now but in the event of cardiac arrest family does not want the patient to be resuscitated.    Bincy Varughese,AG-ACNP Pulmonary & Critical Care

## 2015-11-09 NOTE — Procedures (Signed)
PROCEDURE NOTE: L FEMORAL CVL PLACEMENT  INDICATION:    Monitoring of central venous pressures and/or administration of medications optimally administered in central vein  CONSENT:   Procedure performed emergently  PROCEDURE  Sterile technique was used including antiseptics, cap, gloves, gown, hand hygiene, mask and full body sheet.  Skin prep: Chlorhexidine; local anesthetic administered  A triple lumen catheter was placed in the L femoral vein using the Seldinger technique.   EVALUATION:  Blood flow good  Complications: moderate oozing/bleeding around catheter due to severe coagulopathy Patient tolerated the procedure well.    Billy Fischeravid Simonds, MD PCCM service Mobile [phone]

## 2015-11-09 NOTE — ED Notes (Signed)
Dr. Scotty CourtStafford notified potassium 2.7, troponin 0.13, lactic acid 13.4.

## 2015-11-09 NOTE — Consult Note (Signed)
PHARMACY - PHYSICIAN COMMUNICATION CRITICAL VALUE ALERT - BLOOD CULTURE IDENTIFICATION (BCID)  Results for orders placed or performed during the hospital encounter of 11/14/2015  Blood Culture ID Panel (Reflexed) (Collected: 10/25/2015  8:54 AM)  Result Value Ref Range   Enterococcus species NOT DETECTED NOT DETECTED   Vancomycin resistance NOT DETECTED NOT DETECTED   Listeria monocytogenes NOT DETECTED NOT DETECTED   Staphylococcus species NOT DETECTED NOT DETECTED   Staphylococcus aureus NOT DETECTED NOT DETECTED   Methicillin resistance NOT DETECTED NOT DETECTED   Streptococcus species DETECTED (A) NOT DETECTED   Streptococcus agalactiae DETECTED (A) NOT DETECTED   Streptococcus pneumoniae NOT DETECTED NOT DETECTED   Streptococcus pyogenes NOT DETECTED NOT DETECTED   Acinetobacter baumannii NOT DETECTED NOT DETECTED   Enterobacteriaceae species NOT DETECTED NOT DETECTED   Enterobacter cloacae complex NOT DETECTED NOT DETECTED   Escherichia coli NOT DETECTED NOT DETECTED   Klebsiella oxytoca NOT DETECTED NOT DETECTED   Klebsiella pneumoniae NOT DETECTED NOT DETECTED   Proteus species NOT DETECTED NOT DETECTED   Serratia marcescens NOT DETECTED NOT DETECTED   Carbapenem resistance NOT DETECTED NOT DETECTED   Haemophilus influenzae NOT DETECTED NOT DETECTED   Neisseria meningitidis NOT DETECTED NOT DETECTED   Pseudomonas aeruginosa NOT DETECTED NOT DETECTED   Candida albicans NOT DETECTED NOT DETECTED   Candida glabrata NOT DETECTED NOT DETECTED   Candida krusei NOT DETECTED NOT DETECTED   Candida parapsilosis NOT DETECTED NOT DETECTED   Candida tropicalis NOT DETECTED NOT DETECTED    Name of physician (or Provider) Contacted: Dr. Arsenio LoaderSommer  Changes to prescribed antibiotics required: patient is currently on vancomycin and cefepime. Pt is critically ill. Per Dr. Arsenio LoaderSommer continue broad spectrum abx.   Olene FlossMelissa D Maccia 10/28/2015  7:13 PM

## 2015-11-10 LAB — PREPARE FRESH FROZEN PLASMA
UNIT DIVISION: 0
Unit division: 0

## 2015-11-10 LAB — URINE CULTURE: Culture: >=100000 — AB

## 2015-11-10 LAB — PREPARE PLATELET PHERESIS: UNIT DIVISION: 0

## 2015-11-10 MED FILL — Medication: Qty: 1 | Status: AC

## 2015-11-11 LAB — CULTURE, BLOOD (ROUTINE X 2)

## 2015-11-13 LAB — CULTURE, RESPIRATORY W GRAM STAIN

## 2015-11-13 LAB — GLUCOSE, CAPILLARY: GLUCOSE-CAPILLARY: 84 mg/dL (ref 65–99)

## 2015-11-13 LAB — CULTURE, RESPIRATORY

## 2015-11-24 NOTE — Progress Notes (Signed)
Informed that daughter and significant other were still at bedside and that family from [place] had not arrived yet. Went to room to speak with them. Daughter stated that family would be here by 4-5 am as they got a later start than expected. Explained procedure and that we could have her moved to the morgue and that we would still allow viewing by other family once they arrive. Family requested to wait a while longer as they were on the way.

## 2015-11-24 NOTE — Progress Notes (Signed)
Conversation with deceased pt's daughter, Gypsy Loreris Mcnatt and Ms Jafri's significant other, regarding need to move her mother's body to the morgue.  Significant other expressed concern that there is additional family arriving to view pt's body.  Reassured Ms Shawnie DapperLopez and her significant other that we would make accommodations for the family to view the body in the morgue when they arrive.  St[facility] them to the [facility]. Both Ms Shawnie DapperLopez and her SO verbalized understanding of procedure.  Maggie Schwalbeheryl Moore, [facility] present for entire conversation.

## 2015-11-24 NOTE — Progress Notes (Signed)
Family requested funeral home names as they are trying to decide on crematory. I gave them several local funeral home names and numbers and told them they could call back once decided. They expressed that several other family members were on the way from [place] and would be here in a couple of hours.

## 2015-11-24 DEATH — deceased

## 2015-11-30 MED ORDER — LORAZEPAM 2 MG/ML IJ SOLN
INTRAMUSCULAR | Status: AC
  Filled 2015-11-30: qty 1
